# Patient Record
Sex: Female | Born: 1990
Health system: Southern US, Community
[De-identification: ages and names within clinical notes are randomized; demographics above are authoritative.]

## PROBLEM LIST (undated history)

## (undated) DIAGNOSIS — K589 Irritable bowel syndrome without diarrhea: Secondary | ICD-10-CM

## (undated) DIAGNOSIS — U071 COVID-19: Secondary | ICD-10-CM

## (undated) DIAGNOSIS — K649 Unspecified hemorrhoids: Secondary | ICD-10-CM

## (undated) HISTORY — DX: COVID-19: U07.1

## (undated) HISTORY — DX: Irritable bowel syndrome, unspecified: K58.9

## (undated) HISTORY — DX: Unspecified hemorrhoids: K64.9

## (undated) HISTORY — PX: TONSILLECTOMY: SUR1361

## (undated) HISTORY — PX: WISDOM TOOTH EXTRACTION: SHX21

---
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
## 2012-11-16 HISTORY — PX: TONSILLECTOMY: SUR1361

## 2013-10-23 ENCOUNTER — Ambulatory Visit (INDEPENDENT_AMBULATORY_CARE_PROVIDER_SITE_OTHER): Payer: BC Managed Care – PPO | Admitting: Certified Nurse Midwife

## 2013-10-23 ENCOUNTER — Encounter: Payer: Self-pay | Admitting: Certified Nurse Midwife

## 2013-10-23 VITALS — BP 110/72 | HR 64 | Resp 16 | Ht 62.5 in | Wt 166.0 lb

## 2013-10-23 DIAGNOSIS — Z91018 Allergy to other foods: Secondary | ICD-10-CM

## 2013-10-23 DIAGNOSIS — Z0000 Encounter for general adult medical examination without abnormal findings: Secondary | ICD-10-CM

## 2013-10-23 DIAGNOSIS — Z01419 Encounter for gynecological examination (general) (routine) without abnormal findings: Secondary | ICD-10-CM

## 2013-10-23 LAB — HEMOGLOBIN, FINGERSTICK: Hemoglobin, fingerstick: 13.4 g/dL (ref 12.0–16.0)

## 2013-10-23 LAB — POCT URINALYSIS DIPSTICK
Bilirubin, UA: NEGATIVE
Blood, UA: NEGATIVE
Ketones, UA: NEGATIVE
Protein, UA: NEGATIVE
pH, UA: 6

## 2013-10-23 MED ORDER — EPINEPHRINE 0.3 MG/0.3ML IJ SOAJ: 0.3000 mg | Freq: Once | INTRAMUSCULAR | Status: DC

## 2013-10-23 NOTE — Patient Instructions (Signed)
General topics  Next pap or exam is  due in 1 year Take a Women's multivitamin Take 1200 mg. of calcium daily - prefer dietary If any concerns in interim to call back  Breast Self-Awareness Practicing breast self-awareness may pick up problems early, prevent significant medical complications, and possibly save your life. By practicing breast self-awareness, you can become familiar with how your breasts look and feel and if your breasts are changing. This allows you to notice changes early. It can also offer you some reassurance that your breast health is good. One way to learn what is normal for your breasts and whether your breasts are changing is to do a breast self-exam. If you find a lump or something that was not present in the past, it is best to contact your caregiver right away. Other findings that should be evaluated by your caregiver include nipple discharge, especially if it is bloody; skin changes or reddening; areas where the skin seems to be pulled in (retracted); or new lumps and bumps. Breast pain is seldom associated with cancer (malignancy), but should also be evaluated by a caregiver. BREAST SELF-EXAM The best time to examine your breasts is 5 7 days after your menstrual period is over.  ExitCare Patient Information 2013 [employer].   Exercise to Stay Healthy Exercise helps you become and stay healthy. EXERCISE IDEAS AND TIPS Choose exercises that:  You enjoy.  Fit into your day. You do not need to exercise really hard to be healthy. You can do exercises at a slow or medium level and stay healthy. You can:  Stretch before and after working out.  Try yoga, Pilates, or tai chi.  Lift weights.  Walk fast, swim, jog, run, climb stairs, bicycle, dance, or rollerskate.  Take aerobic classes. Exercises that burn about 150 calories:  Running 1  miles in 15 minutes.  Playing volleyball for 45 to 60 minutes.  Washing and waxing a car for 45 to 60  minutes.  Playing touch football for 45 minutes.  Walking 1  miles in 35 minutes.  Pushing a stroller 1  miles in 30 minutes.  Playing basketball for 30 minutes.  Raking leaves for 30 minutes.  Bicycling 5 miles in 30 minutes.  Walking 2 miles in 30 minutes.  Dancing for 30 minutes.  Shoveling snow for 15 minutes.  Swimming laps for 20 minutes.  Walking up stairs for 15 minutes.  Bicycling 4 miles in 15 minutes.  Gardening for 30 to 45 minutes.  Jumping rope for 15 minutes.  Washing windows or floors for 45 to 60 minutes. Document Released: 12/05/2010 Document Revised: 01/25/2012 Document Reviewed: 12/05/2010 ExitCare Patient Information 2013 [employer].   Other topics ( that may be useful information):    Sexually Transmitted Disease Sexually transmitted disease (STD) refers to any infection that is passed from person to person during sexual activity. This may happen by way of saliva, semen, blood, vaginal mucus, or urine. Common STDs include:  Gonorrhea.  Chlamydia.  Syphilis.  HIV/AIDS.  Genital herpes.  Hepatitis B and C.  Trichomonas.  Human papillomavirus (HPV).  Pubic lice. CAUSES  An STD may be spread by bacteria, virus, or parasite. A person can get an STD by:  Sexual intercourse with an infected person.  Sharing sex toys with an infected person.  Sharing needles with an infected person.  Having intimate contact with the genitals, mouth, or rectal areas of an infected person. SYMPTOMS  Some people may not have any symptoms, but   they can still pass the infection to others. Different STDs have different symptoms. Symptoms include:  Painful or bloody urination.  Pain in the pelvis, abdomen, vagina, anus, throat, or eyes.  Skin rash, itching, irritation, growths, or sores (lesions). These usually occur in the genital or anal area.  Abnormal vaginal discharge.  Penile discharge in men.  Soft, flesh-colored skin growths in the  genital or anal area.  Fever.  Pain or bleeding during sexual intercourse.  Swollen glands in the groin area.  Yellow skin and eyes (jaundice). This is seen with hepatitis. DIAGNOSIS  To make a diagnosis, your caregiver may:  Take a medical history.  Perform a physical exam.  Take a specimen (culture) to be examined.  Examine a sample of discharge under a microscope.  Perform blood test TREATMENT   Chlamydia, gonorrhea, trichomonas, and syphilis can be cured with antibiotic medicine.  Genital herpes, hepatitis, and HIV can be treated, but not cured, with prescribed medicines. The medicines will lessen the symptoms.  Genital warts from HPV can be treated with medicine or by freezing, burning (electrocautery), or surgery. Warts may come back.  HPV is a virus and cannot be cured with medicine or surgery.However, abnormal areas may be followed very closely by your caregiver and may be removed from the cervix, vagina, or vulva through office procedures or surgery. If your diagnosis is confirmed, your recent sexual partners need treatment. This is true even if they are symptom-free or have a negative culture or evaluation. They should not have sex until their caregiver says it is okay. HOME CARE INSTRUCTIONS  All sexual partners should be informed, tested, and treated for all STDs.  Take your antibiotics as directed. Finish them even if you start to feel better.  Only take over-the-counter or prescription medicines for pain, discomfort, or fever as directed by your caregiver.  Rest.  Eat a balanced diet and drink enough fluids to keep your urine clear or pale yellow.  Do not have sex until treatment is completed and you have followed up with your caregiver. STDs should be checked after treatment.  Keep all follow-up appointments, Pap tests, and blood tests as directed by your caregiver.  Only use latex condoms and water-soluble lubricants during sexual activity. Do not use  petroleum jelly or oils.  Avoid alcohol and illegal drugs.  Get vaccinated for HPV and hepatitis. If you have not received these vaccines in the past, talk to your caregiver about whether one or both might be right for you.  Avoid risky sex practices that can break the skin. The only way to avoid getting an STD is to avoid all sexual activity.Latex condoms and dental dams (for oral sex) will help lessen the risk of getting an STD, but will not completely eliminate the risk. SEEK MEDICAL CARE IF:   You have a fever.  You have any new or worsening symptoms. Document Released: 01/23/2003 Document Revised: 01/25/2012 Document Reviewed: 01/30/2011 ExitCare Patient Information 2013 [employer].    Domestic Abuse You are being battered or abused if someone close to you hits, pushes, or physically hurts you in any way. You also are being abused if you are forced into activities. You are being sexually abused if you are forced to have sexual contact of any kind. You are being emotionally abused if you are made to feel worthless or if you are constantly threatened. It is important to remember that help is available. No one has the right to abuse you. PREVENTION OF FURTHER   ABUSE  Learn the warning signs of danger. This varies with situations but may include: the use of alcohol, threats, isolation from friends and family, or forced sexual contact. Leave if you feel that violence is going to occur.  If you are attacked or beaten, report it to the police so the abuse is documented. You do not have to press charges. The police can protect you while you or the attackers are leaving. Get the officer's name and badge number and a copy of the report.  Find someone you can trust and tell them what is happening to you: your caregiver, a nurse, clergy member, close friend or family member. Feeling ashamed is natural, but remember that you have done nothing wrong. No one deserves abuse. Document Released:  10/30/2000 Document Revised: 01/25/2012 Document Reviewed: 01/08/2011 ExitCare Patient Information 2013 [employer]Care[employer].    How Much is Too Much Alcohol? Drinking too much alcohol can cause injury, accidents, and health problems. These types of problems can include:   Car crashes.  Falls.  Family fighting (domestic violence).  Drowning.  Fights.  Injuries.  Burns.  Damage to certain organs.  Having a baby with birth defects. ONE DRINK CAN BE TOO MUCH WHEN YOU ARE:  Working.  Pregnant or breastfeeding.  Taking medicines. Ask your doctor.  Driving or planning to drive. If you or someone you know has a drinking problem, get help from a doctor.  Document Released: 08/29/2009 Document Revised: 01/25/2012 Document Reviewed: 08/29/2009 ExitCare Patient Information 2013 [employer].   Smoking Hazards Smoking cigarettes is extremely bad for your health. Tobacco smoke has over 200 known poisons in it. There are over 60 chemicals in tobacco smoke that cause cancer. Some of the chemicals found in cigarette smoke include:   Cyanide.  Benzene.  Formaldehyde.  Methanol (wood alcohol).  Acetylene (fuel used in welding torches).  Ammonia. Cigarette smoke also contains the poisonous gases nitrogen oxide and carbon monoxide.  Cigarette smokers have an increased risk of many serious medical problems and Smoking causes approximately:  90% of all lung cancer deaths in men.  80% of all lung cancer deaths in women.  90% of deaths from chronic obstructive lung disease. Compared with nonsmokers, smoking increases the risk of:  Coronary heart disease by 2 to 4 times.  Stroke by 2 to 4 times.  Men developing lung cancer by 23 times.  Women developing lung cancer by 13 times.  Dying from chronic obstructive lung diseases by 12 times.  . Smoking is the most preventable cause of death and disease in our society.  WHY IS SMOKING ADDICTIVE?  Nicotine is the chemical  agent in tobacco that is capable of causing addiction or dependence.  When you smoke and inhale, nicotine is absorbed rapidly into the bloodstream through your lungs. Nicotine absorbed through the lungs is capable of creating a powerful addiction. Both inhaled and non-inhaled nicotine may be addictive.  Addiction studies of cigarettes and spit tobacco show that addiction to nicotine occurs mainly during the teen years, when young people begin using tobacco products. WHAT ARE THE BENEFITS OF QUITTING?  There are many health benefits to quitting smoking.   Likelihood of developing cancer and heart disease decreases. Health improvements are seen almost immediately.  Blood pressure, pulse rate, and breathing patterns start returning to normal soon after quitting. QUITTING SMOKING   American Lung Association - 1-800-LUNGUSA  American Cancer Society - 1-800-ACS-2345 Document Released: 12/10/2004 Document Revised: 01/25/2012 Document Reviewed: 08/14/2009 ExitCare Patient Information 2013 ExitCare[employer]   LL[employer].   Stress Management Stress is a state of physical or mental tension that often results from changes in your life or normal routine. Some common causes of stress are:  Death of a loved one.  Injuries or severe illnesses.  Getting fired or changing jobs.  Moving into a new home. Other causes may be:  Sexual problems.  Business or financial losses.  Taking on a large debt.  Regular conflict with someone at home or at work.  Constant tiredness from lack of sleep. It is not just bad things that are stressful. It may be stressful to:  Win the lottery.  Get married.  Buy a new car. The amount of stress that can be easily tolerated varies from person to person. Changes generally cause stress, regardless of the types of change. Too much stress can affect your health. It may lead to physical or emotional problems. Too little stress (boredom) may also become stressful. SUGGESTIONS TO  REDUCE STRESS:  Talk things over with your family and friends. It often is helpful to share your concerns and worries. If you feel your problem is serious, you may want to get help from a professional counselor.  Consider your problems one at a time instead of lumping them all together. Trying to take care of everything at once may seem impossible. List all the things you need to do and then start with the most important one. Set a goal to accomplish 2 or 3 things each day. If you expect to do too many in a single day you will naturally fail, causing you to feel even more stressed.  Do not use alcohol or drugs to relieve stress. Although you may feel better for a short time, they do not remove the problems that caused the stress. They can also be habit forming.  Exercise regularly - at least 3 times per week. Physical exercise can help to relieve that "uptight" feeling and will relax you.  The shortest distance between despair and hope is often a good night's sleep.  Go to bed and get up on time allowing yourself time for appointments without being rushed.  Take a short "time-out" period from any stressful situation that occurs during the day. Close your eyes and take some deep breaths. Starting with the muscles in your face, tense them, hold it for a few seconds, then relax. Repeat this with the muscles in your neck, shoulders, hand, stomach, back and legs.  Take good care of yourself. Eat a balanced diet and get plenty of rest.  Schedule time for having fun. Take a break from your daily routine to relax. HOME CARE INSTRUCTIONS   Call if you feel overwhelmed by your problems and feel you can no longer manage them on your own.  Return immediately if you feel like hurting yourself or someone else. Document Released: 04/28/2001 Document Revised: 01/25/2012 Document Reviewed: 12/19/2007 ExitCare Patient Information 2013 ExitCare[employer].   

## 2013-10-23 NOTE — Progress Notes (Signed)
22 y.o. G0P0000 Single Caucasian Fe here for annual exam. Periods normal no issues. Not sexually active ever. 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 Will be studying in [place] in 2015! Needs new epi pen to take with her on trip, due tree nut allergic reaction. (hers has expired). Aware how to use, if needed. No health issues today. Sees PCP prn.  Patient's last menstrual period was 10/15/2013.          Sexually active: no  The current method of family planning is abstinence.    Exercising: yes  low impact cardio & yoga Smoker:  no  Health Maintenance: Pap:  none MMG:  none Colonoscopy:  none BMD:   none TDaP:  2013 Labs: HGB-13.4, Poct urine-ph6.0 Self breast exam: done monthly   reports that she has never smoked. She does not have any smokeless tobacco history on file. She reports that she does not drink alcohol or use illicit drugs.  History reviewed. No pertinent past medical history.  Past Surgical History  Procedure Laterality Date  . Wisdom tooth extraction    . Tonsillectomy      No current outpatient prescriptions on file.   No current facility-administered medications for this visit.    Family History  Problem Relation Age of Onset  . Stroke Father     ROS:  Pertinent items are noted in HPI.  Otherwise, a comprehensive ROS was negative.  Exam:   BP 110/72  Pulse 64  Resp 16  Ht 5' 2.5" (1.588 m)  Wt 166 lb (75.297 kg)  BMI 29.86 kg/m2  LMP 10/15/2013 Height: 5' 2.5" (158.8 cm)  Ht Readings from Last 3 Encounters:  10/23/13 5' 2.5" (1.588 m)    General appearance: alert, cooperative and appears stated age Head: Normocephalic, without obvious abnormality, atraumatic Neck: no adenopathy, supple, symmetrical, trachea midline and thyroid normal to inspection and palpation and non-palpable Lungs: clear to auscultation bilaterally Breasts: normal appearance, no masses or tenderness, No nipple retraction or dimpling, No nipple discharge or bleeding, No  axillary or supraclavicular adenopathy Heart: regular rate and rhythm Abdomen: soft, non-tender; no masses,  no organomegaly Extremities: extremities normal, atraumatic, no cyanosis or edema Skin: Skin color, texture, turgor normal. No rashes or lesions Lymph nodes: Cervical, supraclavicular, and axillary nodes normal. No abnormal inguinal nodes palpated Neurologic: Grossly normal   Pelvic: External genitalia:  no lesions              Urethra:  normal appearing urethra with no masses, tenderness or lesions              Bartholin's and Skene's: normal                 Vagina: normal appearing vagina with normal color and discharge, no lesions              Cervix: normal, non tender              Pap taken: no Bimanual Exam:  Uterus:  normal size, contour, position, consistency, mobility, non-tender and anteverted              Adnexa: normal adnexa and no mass, fullness, tenderness               Rectovaginal: Confirms               Anus:  deferred  A:  Well Woman with normal exam  Contraception none needed never sexually active  History of severe allergy with tree nuts,  needs refill on Epi pen  P:   Reviewed health and wellness pertinent to exam  Will advise if change  Rx Epi pen see order with instructions on how to use reviewed  Pap smear as per guidelines at age 46  pap smear not taken today   counseled on breast self exam, STD prevention, HIV risk factors and prevention, adequate intake of calcium and vitamin D, diet and exercise  return annually or prn  An After Visit Summary was printed and given to the patient.

## 2013-10-24 NOTE — Progress Notes (Signed)
Note reviewed, agree with plan.  Jawanna Dykman, MD  

## 2014-10-17 DIAGNOSIS — B9689 Other specified bacterial agents as the cause of diseases classified elsewhere: Secondary | ICD-10-CM | POA: Insufficient documentation

## 2014-10-17 DIAGNOSIS — J019 Acute sinusitis, unspecified: Secondary | ICD-10-CM

## 2014-11-13 ENCOUNTER — Encounter: Payer: Self-pay | Admitting: Certified Nurse Midwife

## 2014-11-13 ENCOUNTER — Ambulatory Visit (INDEPENDENT_AMBULATORY_CARE_PROVIDER_SITE_OTHER): Payer: BC Managed Care – PPO | Admitting: Certified Nurse Midwife

## 2014-11-13 VITALS — BP 110/68 | HR 70 | Resp 16 | Ht 62.25 in | Wt 147.0 lb

## 2014-11-13 DIAGNOSIS — Z0000 Encounter for general adult medical examination without abnormal findings: Secondary | ICD-10-CM

## 2014-11-13 DIAGNOSIS — Z124 Encounter for screening for malignant neoplasm of cervix: Secondary | ICD-10-CM

## 2014-11-13 DIAGNOSIS — Z01419 Encounter for gynecological examination (general) (routine) without abnormal findings: Secondary | ICD-10-CM

## 2014-11-13 LAB — POCT URINALYSIS DIPSTICK
BILIRUBIN UA: NEGATIVE
Glucose, UA: NEGATIVE
KETONES UA: NEGATIVE
Leukocytes, UA: NEGATIVE
Nitrite, UA: NEGATIVE
Protein, UA: NEGATIVE
RBC UA: NEGATIVE
UROBILINOGEN UA: NEGATIVE
pH, UA: 5

## 2014-11-13 NOTE — Progress Notes (Signed)
23 y.o. G0P0000 Single Caucasian Fe here for annual exam. Periods normal, no issues. Contraception condoms working well. Desires STD screening. Enjoying college at [employer]!  Has lost 21 pounds since last aex!  Sees urgent care as needed. No health issues today.  Patient's last menstrual period was 11/10/2014.          Sexually active: Yes.    The current method of family planning is condoms most of the time.    Exercising: No.  exercise Smoker:  no  Health Maintenance: Pap:  none MMG:  none Colonoscopy:  none BMD:   none TDaP:  2013 Labs: Poct urine-neg Self breast exam: done monthly   reports that she has never smoked. She does not have any smokeless tobacco history on file. She reports that she drinks alcohol. She reports that she does not use illicit drugs.  History reviewed. No pertinent past medical history.  Past Surgical History  Procedure Laterality Date  . Wisdom tooth extraction    . Tonsillectomy      Current Outpatient Prescriptions  Medication Sig Dispense Refill  . EPINEPHrine (EPI-PEN) 0.3 mg/0.3 mL SOAJ injection Inject 0.3 mLs (0.3 mg total) into the muscle once. 2 Device 0   No current facility-administered medications for this visit.    Family History  Problem Relation Age of Onset  . Stroke Father     ROS:  Pertinent items are noted in HPI.  Otherwise, a comprehensive ROS was negative.  Exam:   BP 110/68 mmHg  Pulse 70  Resp 16  Ht 5' 2.25" (1.581 m)  Wt 147 lb (66.679 kg)  BMI 26.68 kg/m2  LMP 11/10/2014 Height: 5' 2.25" (158.1 cm)  Ht Readings from Last 3 Encounters:  11/13/14 5' 2.25" (1.581 m)  10/23/13 5' 2.5" (1.588 m)    General appearance: alert, cooperative and appears stated age Head: Normocephalic, without obvious abnormality, atraumatic Neck: no adenopathy, supple, symmetrical, trachea midline and thyroid normal to inspection and palpation Lungs: clear to auscultation bilaterally Breasts: normal appearance, no masses or  tenderness, No nipple retraction or dimpling, No nipple discharge or bleeding, No axillary or supraclavicular adenopathy Heart: regular rate and rhythm Abdomen: soft, non-tender; no masses,  no organomegaly Extremities: extremities normal, atraumatic, no cyanosis or edema Skin: Skin color, texture, turgor normal. No rashes or lesions Lymph nodes: Cervical, supraclavicular, and axillary nodes normal. No abnormal inguinal nodes palpated Neurologic: Grossly normal   Pelvic: External genitalia:  no lesions              Urethra:  normal appearing urethra with no masses, tenderness or lesions              Bartholin's and Skene's: normal                 Vagina: normal appearing vagina with normal color and discharge, no lesions              Cervix: normal, non tender, no lesions              Pap taken: Yes.   Bimanual Exam:  Uterus:  normal size, contour, position, consistency, mobility, non-tender and anteverted              Adnexa: normal adnexa and no mass, fullness, tenderness               Rectovaginal: Confirms               Anus:  normal sphincter tone, no lesions  A:  Well Woman with normal exam  Contraception consistent condom use  STD screening GC, Chlamydia only  P:   Reviewed health and wellness pertinent to exam  Discussed importance of consistent use for contraception  Labs: GC,Chlamydia  Pap smear taken today with HPV reflex   counseled on breast self exam, STD prevention, HIV risk factors and prevention, adequate intake of calcium and vitamin D, diet and exercise  return annually or prn  An After Visit Summary was printed and given to the patient.

## 2014-11-13 NOTE — Patient Instructions (Signed)
General topics  Next pap or exam is  due in 1 year Take a Women's multivitamin Take 1200 mg. of calcium daily - prefer dietary If any concerns in interim to call back  Breast Self-Awareness Practicing breast self-awareness may pick up problems early, prevent significant medical complications, and possibly save your life. By practicing breast self-awareness, you can become familiar with how your breasts look and feel and if your breasts are changing. This allows you to notice changes early. It can also offer you some reassurance that your breast health is good. One way to learn what is normal for your breasts and whether your breasts are changing is to do a breast self-exam. If you find a lump or something that was not present in the past, it is best to contact your caregiver right away. Other findings that should be evaluated by your caregiver include nipple discharge, especially if it is bloody; skin changes or reddening; areas where the skin seems to be pulled in (retracted); or new lumps and bumps. Breast pain is seldom associated with cancer (malignancy), but should also be evaluated by a caregiver. BREAST SELF-EXAM The best time to examine your breasts is 5 7 days after your menstrual period is over.  ExitCare Patient Information 2013 [employer].   Exercise to Stay Healthy Exercise helps you become and stay healthy. EXERCISE IDEAS AND TIPS Choose exercises that:  You enjoy.  Fit into your day. You do not need to exercise really hard to be healthy. You can do exercises at a slow or medium level and stay healthy. You can:  Stretch before and after working out.  Try yoga, Pilates, or tai chi.  Lift weights.  Walk fast, swim, jog, run, climb stairs, bicycle, dance, or rollerskate.  Take aerobic classes. Exercises that burn about 150 calories:  Running 1  miles in 15 minutes.  Playing volleyball for 45 to 60 minutes.  Washing and waxing a car for 45 to 60  minutes.  Playing touch football for 45 minutes.  Walking 1  miles in 35 minutes.  Pushing a stroller 1  miles in 30 minutes.  Playing basketball for 30 minutes.  Raking leaves for 30 minutes.  Bicycling 5 miles in 30 minutes.  Walking 2 miles in 30 minutes.  Dancing for 30 minutes.  Shoveling snow for 15 minutes.  Swimming laps for 20 minutes.  Walking up stairs for 15 minutes.  Bicycling 4 miles in 15 minutes.  Gardening for 30 to 45 minutes.  Jumping rope for 15 minutes.  Washing windows or floors for 45 to 60 minutes. Document Released: 12/05/2010 Document Revised: 01/25/2012 Document Reviewed: 12/05/2010 [employer] Patient Information 2013 [employer].   Other topics ( that may be useful information):    Sexually Transmitted Disease Sexually transmitted disease (STD) refers to any infection that is passed from person to person during sexual activity. This may happen by way of saliva, semen, blood, vaginal mucus, or urine. Common STDs include:  Gonorrhea.  Chlamydia.  Syphilis.  HIV/AIDS.  Genital herpes.  Hepatitis B and C.  Trichomonas.  Human papillomavirus (HPV).  Pubic lice. CAUSES  An STD may be spread by bacteria, virus, or parasite. A person can get an STD by:  Sexual intercourse with an infected person.  Sharing sex toys with an infected person.  Sharing needles with an infected person.  Having intimate contact with the genitals, mouth, or rectal areas of an infected person. SYMPTOMS  Some people may not have any symptoms, but   they can still pass the infection to others. Different STDs have different symptoms. Symptoms include:  Painful or bloody urination.  Pain in the pelvis, abdomen, vagina, anus, throat, or eyes.  Skin rash, itching, irritation, growths, or sores (lesions). These usually occur in the genital or anal area.  Abnormal vaginal discharge.  Penile discharge in men.  Soft, flesh-colored skin growths in the  genital or anal area.  Fever.  Pain or bleeding during sexual intercourse.  Swollen glands in the groin area.  Yellow skin and eyes (jaundice). This is seen with hepatitis. DIAGNOSIS  To make a diagnosis, your caregiver may:  Take a medical history.  Perform a physical exam.  Take a specimen (culture) to be examined.  Examine a sample of discharge under a microscope.  Perform blood test TREATMENT   Chlamydia, gonorrhea, trichomonas, and syphilis can be cured with antibiotic medicine.  Genital herpes, hepatitis, and HIV can be treated, but not cured, with prescribed medicines. The medicines will lessen the symptoms.  Genital warts from HPV can be treated with medicine or by freezing, burning (electrocautery), or surgery. Warts may come back.  HPV is a virus and cannot be cured with medicine or surgery.However, abnormal areas may be followed very closely by your caregiver and may be removed from the cervix, vagina, or vulva through office procedures or surgery. If your diagnosis is confirmed, your recent sexual partners need treatment. This is true even if they are symptom-free or have a negative culture or evaluation. They should not have sex until their caregiver says it is okay. HOME CARE INSTRUCTIONS  All sexual partners should be informed, tested, and treated for all STDs.  Take your antibiotics as directed. Finish them even if you start to feel better.  Only take over-the-counter or prescription medicines for pain, discomfort, or fever as directed by your caregiver.  Rest.  Eat a balanced diet and drink enough fluids to keep your urine clear or pale yellow.  Do not have sex until treatment is completed and you have followed up with your caregiver. STDs should be checked after treatment.  Keep all follow-up appointments, Pap tests, and blood tests as directed by your caregiver.  Only use latex condoms and water-soluble lubricants during sexual activity. Do not use  petroleum jelly or oils.  Avoid alcohol and illegal drugs.  Get vaccinated for HPV and hepatitis. If you have not received these vaccines in the past, talk to your caregiver about whether one or both might be right for you.  Avoid risky sex practices that can break the skin. The only way to avoid getting an STD is to avoid all sexual activity.Latex condoms and dental dams (for oral sex) will help lessen the risk of getting an STD, but will not completely eliminate the risk. SEEK MEDICAL CARE IF:   You have a fever.  You have any new or worsening symptoms. Document Released: 01/23/2003 Document Revised: 01/25/2012 Document Reviewed: 01/30/2011 [facility] Patient Information 2013 [employer].    Domestic Abuse You are being battered or abused if someone close to you hits, pushes, or physically hurts you in any way. You also are being abused if you are forced into activities. You are being sexually abused if you are forced to have sexual contact of any kind. You are being emotionally abused if you are made to feel worthless or if you are constantly threatened. It is important to remember that help is available. No one has the right to abuse you. PREVENTION OF FURTHER  ABUSE  Learn the warning signs of danger. This varies with situations but may include: the use of alcohol, threats, isolation from friends and family, or forced sexual contact. Leave if you feel that violence is going to occur.  If you are attacked or beaten, report it to the police so the abuse is documented. You do not have to press charges. The police can protect you while you or the attackers are leaving. Get the officer's name and badge number and a copy of the report.  Find someone you can trust and tell them what is happening to you: your caregiver, a nurse, clergy member, close friend or family member. Feeling ashamed is natural, but remember that you have done nothing wrong. No one deserves abuse. Document Released:  10/30/2000 Document Revised: 01/25/2012 Document Reviewed: 01/08/2011 [employer] Patient Information 2013 [employer].    How Much is Too Much Alcohol? Drinking too much alcohol can cause injury, accidents, and health problems. These types of problems can include:   Car crashes.  Falls.  Family fighting (domestic violence).  Drowning.  Fights.  Injuries.  Burns.  Damage to certain organs.  Having a baby with birth defects. ONE DRINK CAN BE TOO MUCH WHEN YOU ARE:  Working.  Pregnant or breastfeeding.  Taking medicines. Ask your doctor.  Driving or planning to drive. If you or someone you know has a drinking problem, get help from a doctor.  Document Released: 08/29/2009 Document Revised: 01/25/2012 Document Reviewed: 08/29/2009 Exit[employer] Patient Information 2013 [employer].   Smoking Hazards Smoking cigarettes is extremely bad for your health. Tobacco smoke has over 200 known poisons in it. There are over 60 chemicals in tobacco smoke that cause cancer. Some of the chemicals found in cigarette smoke include:   Cyanide.  Benzene.  Formaldehyde.  Methanol (wood alcohol).  Acetylene (fuel used in welding torches).  Ammonia. Cigarette smoke also contains the poisonous gases nitrogen oxide and carbon monoxide.  Cigarette smokers have an increased risk of many serious medical problems and Smoking causes approximately:  90% of all lung cancer deaths in men.  80% of all lung cancer deaths in women.  90% of deaths from chronic obstructive lung disease. Compared with nonsmokers, smoking increases the risk of:  Coronary heart disease by 2 to 4 times.  Stroke by 2 to 4 times.  Men developing lung cancer by 23 times.  Women developing lung cancer by 13 times.  Dying from chronic obstructive lung diseases by 12 times.  . Smoking is the most preventable cause of death and disease in our society.  WHY IS SMOKING ADDICTIVE?  Nicotine is the chemical  agent in tobacco that is capable of causing addiction or dependence.  When you smoke and inhale, nicotine is absorbed rapidly into the bloodstream through your lungs. Nicotine absorbed through the lungs is capable of creating a powerful addiction. Both inhaled and non-inhaled nicotine may be addictive.  Addiction studies of cigarettes and spit tobacco show that addiction to nicotine occurs mainly during the teen years, when young people begin using tobacco products. WHAT ARE THE BENEFITS OF QUITTING?  There are many health benefits to quitting smoking.   Likelihood of developing cancer and heart disease decreases. Health improvements are seen almost immediately.  Blood pressure, pulse rate, and breathing patterns start returning to normal soon after quitting. QUITTING SMOKING   American Lung Association - 1-800-LUNGUSA  American Cancer Society - 1-800-ACS-2345 Document Released: 12/10/2004 Document Revised: 01/25/2012 Document Reviewed: 08/14/2009 Exit[employer] Patient Information 2013 [employer].   Stress Management Stress is a state of physical or mental tension that often results from changes in your life or normal routine. Some common causes of stress are:  Death of a loved one.  Injuries or severe illnesses.  Getting fired or changing jobs.  Moving into a new home. Other causes may be:  Sexual problems.  Business or financial losses.  Taking on a large debt.  Regular conflict with someone at home or at work.  Constant tiredness from lack of sleep. It is not just bad things that are stressful. It may be stressful to:  Win the lottery.  Get married.  Buy a new car. The amount of stress that can be easily tolerated varies from person to person. Changes generally cause stress, regardless of the types of change. Too much stress can affect your health. It may lead to physical or emotional problems. Too little stress (boredom) may also become stressful. SUGGESTIONS TO  REDUCE STRESS:  Talk things over with your family and friends. It often is helpful to share your concerns and worries. If you feel your problem is serious, you may want to get help from a professional counselor.  Consider your problems one at a time instead of lumping them all together. Trying to take care of everything at once may seem impossible. List all the things you need to do and then start with the most important one. Set a goal to accomplish 2 or 3 things each day. If you expect to do too many in a single day you will naturally fail, causing you to feel even more stressed.  Do not use alcohol or drugs to relieve stress. Although you may feel better for a short time, they do not remove the problems that caused the stress. They can also be habit forming.  Exercise regularly - at least 3 times per week. Physical exercise can help to relieve that "uptight" feeling and will relax you.  The shortest distance between despair and hope is often a good night's sleep.  Go to bed and get up on time allowing yourself time for appointments without being rushed.  Take a short "time-out" period from any stressful situation that occurs during the day. Close your eyes and take some deep breaths. Starting with the muscles in your face, tense them, hold it for a few seconds, then relax. Repeat this with the muscles in your neck, shoulders, hand, stomach, back and legs.  Take good care of yourself. Eat a balanced diet and get plenty of rest.  Schedule time for having fun. Take a break from your daily routine to relax. HOME CARE INSTRUCTIONS   Call if you feel overwhelmed by your problems and feel you can no longer manage them on your own.  Return immediately if you feel like hurting yourself or someone else. Document Released: 04/28/2001 Document Revised: 01/25/2012 Document Reviewed: 12/19/2007 Exit[employer] Patient Information 2013 [employer].   

## 2014-11-14 LAB — IPS PAP TEST WITH REFLEX TO HPV

## 2014-11-14 NOTE — Progress Notes (Signed)
Reviewed personally.  M. Suzanne Pamela Maddy, MD.  

## 2014-11-15 LAB — IPS N GONORRHOEA AND CHLAMYDIA BY PCR

## 2015-05-22 ENCOUNTER — Telehealth: Payer: Self-pay | Admitting: Certified Nurse Midwife

## 2015-05-22 NOTE — Telephone Encounter (Signed)
Spoke with patient. Patient states that she would like to come in for annual exam. Advised last aex was 11/13/2014. Insurance covers 1 aex per year. Patient denies any current concerns or problems. Offered to schedule appointment for OV if she would like to meet with Felicia Lambert CNM and have any labs performed. Patient declines appointment. Patient would like to wait until aex appointment. Will return call if she would like to be seen earlier in office.  Routing to provider for final review. Patient agreeable to disposition. Will close encounter.   Patient aware provider will review message and nurse will return call if any additional advice or change of disposition.

## 2015-05-22 NOTE — Telephone Encounter (Signed)
Patient says she didn't get bloodwork done last time she was in and would like to come in for that.

## 2015-11-21 ENCOUNTER — Ambulatory Visit (INDEPENDENT_AMBULATORY_CARE_PROVIDER_SITE_OTHER): Payer: BLUE CROSS/BLUE SHIELD | Admitting: Certified Nurse Midwife

## 2015-11-21 ENCOUNTER — Encounter: Payer: Self-pay | Admitting: Certified Nurse Midwife

## 2015-11-21 VITALS — BP 104/60 | HR 68 | Resp 16 | Ht 62.5 in | Wt 140.0 lb

## 2015-11-21 DIAGNOSIS — Z01419 Encounter for gynecological examination (general) (routine) without abnormal findings: Secondary | ICD-10-CM

## 2015-11-21 DIAGNOSIS — Z30011 Encounter for initial prescription of contraceptive pills: Secondary | ICD-10-CM | POA: Diagnosis not present

## 2015-11-21 DIAGNOSIS — Z0000 Encounter for general adult medical examination without abnormal findings: Secondary | ICD-10-CM

## 2015-11-21 LAB — CBC
HEMATOCRIT: 39 % (ref 36.0–46.0)
Hemoglobin: 13.4 g/dL (ref 12.0–15.0)
MCH: 29.7 pg (ref 26.0–34.0)
MCHC: 34.4 g/dL (ref 30.0–36.0)
MCV: 86.5 fL (ref 78.0–100.0)
MPV: 9.9 fL (ref 8.6–12.4)
Platelets: 282 10*3/uL (ref 150–400)
RBC: 4.51 MIL/uL (ref 3.87–5.11)
RDW: 14 % (ref 11.5–15.5)
WBC: 8.9 10*3/uL (ref 4.0–10.5)

## 2015-11-21 LAB — POCT URINALYSIS DIPSTICK
Bilirubin, UA: NEGATIVE
Blood, UA: NEGATIVE
Glucose, UA: NEGATIVE
Ketones, UA: NEGATIVE
LEUKOCYTES UA: NEGATIVE
NITRITE UA: NEGATIVE
PROTEIN UA: NEGATIVE
Urobilinogen, UA: NEGATIVE
pH, UA: 5

## 2015-11-21 LAB — HEMOGLOBIN, FINGERSTICK: Hemoglobin, fingerstick: 12.9 g/dL (ref 12.0–16.0)

## 2015-11-21 MED ORDER — NORETHIN ACE-ETH ESTRAD-FE 1-20 MG-MCG(24) PO TABS: 1.0000 | ORAL_TABLET | Freq: Every day | ORAL | Status: DC

## 2015-11-21 NOTE — Patient Instructions (Addendum)
General topics  Next pap or exam is  due in 1 year Take a Women's multivitamin Take 1200 mg. of calcium daily - prefer dietary If any concerns in interim to call back  Breast Self-Awareness Practicing breast self-awareness may pick up problems early, prevent significant medical complications, and possibly save your life. By practicing breast self-awareness, you can become familiar with how your breasts look and feel and if your breasts are changing. This allows you to notice changes early. It can also offer you some reassurance that your breast health is good. One way to learn what is normal for your breasts and whether your breasts are changing is to do a breast self-exam. If you find a lump or something that was not present in the past, it is best to contact your caregiver right away. Other findings that should be evaluated by your caregiver include nipple discharge, especially if it is bloody; skin changes or reddening; areas where the skin seems to be pulled in (retracted); or new lumps and bumps. Breast pain is seldom associated with cancer (malignancy), but should also be evaluated by a caregiver. BREAST SELF-EXAM The best time to examine your breasts is 5 7 days after your menstrual period is over.  ExitCare Patient Information 2013 [employer].   Exercise to Stay Healthy Exercise helps you become and stay healthy. EXERCISE IDEAS AND TIPS Choose exercises that:  You enjoy.  Fit into your day. You do not need to exercise really hard to be healthy. You can do exercises at a slow or medium level and stay healthy. You can:  Stretch before and after working out.  Try yoga, Pilates, or tai chi.  Lift weights.  Walk fast, swim, jog, run, climb stairs, bicycle, dance, or rollerskate.  Take aerobic classes. Exercises that burn about 150 calories:  Running 1  miles in 15 minutes.  Playing volleyball for 45 to 60 minutes.  Washing and waxing a car for 45 to 60  minutes.  Playing touch football for 45 minutes.  Walking 1  miles in 35 minutes.  Pushing a stroller 1  miles in 30 minutes.  Playing basketball for 30 minutes.  Raking leaves for 30 minutes.  Bicycling 5 miles in 30 minutes.  Walking 2 miles in 30 minutes.  Dancing for 30 minutes.  Shoveling snow for 15 minutes.  Swimming laps for 20 minutes.  Walking up stairs for 15 minutes.  Bicycling 4 miles in 15 minutes.  Gardening for 30 to 45 minutes.  Jumping rope for 15 minutes.  Washing windows or floors for 45 to 60 minutes. Document Released: 12/05/2010 Document Revised: 01/25/2012 Document Reviewed: 12/05/2010 ExitCare Patient Information 2013 [employer].   Other topics ( that may be useful information):    Sexually Transmitted Disease Sexually transmitted disease (STD) refers to any infection that is passed from person to person during sexual activity. This may happen by way of saliva, semen, blood, vaginal mucus, or urine. Common STDs include:  Gonorrhea.  Chlamydia.  Syphilis.  HIV/AIDS.  Genital herpes.  Hepatitis B and C.  Trichomonas.  Human papillomavirus (HPV).  Pubic lice. CAUSES  An STD may be spread by bacteria, virus, or parasite. A person can get an STD by:  Sexual intercourse with an infected person.  Sharing sex toys with an infected person.  Sharing needles with an infected person.  Having intimate contact with the genitals, mouth, or rectal areas of an infected person. SYMPTOMS  Some people may not have any symptoms, but   they can still pass the infection to others. Different STDs have different symptoms. Symptoms include:  Painful or bloody urination.  Pain in the pelvis, abdomen, vagina, anus, throat, or eyes.  Skin rash, itching, irritation, growths, or sores (lesions). These usually occur in the genital or anal area.  Abnormal vaginal discharge.  Penile discharge in men.  Soft, flesh-colored skin growths in the  genital or anal area.  Fever.  Pain or bleeding during sexual intercourse.  Swollen glands in the groin area.  Yellow skin and eyes (jaundice). This is seen with hepatitis. DIAGNOSIS  To make a diagnosis, your caregiver may:  Take a medical history.  Perform a physical exam.  Take a specimen (culture) to be examined.  Examine a sample of discharge under a microscope.  Perform blood test TREATMENT   Chlamydia, gonorrhea, trichomonas, and syphilis can be cured with antibiotic medicine.  Genital herpes, hepatitis, and HIV can be treated, but not cured, with prescribed medicines. The medicines will lessen the symptoms.  Genital warts from HPV can be treated with medicine or by freezing, burning (electrocautery), or surgery. Warts may come back.  HPV is a virus and cannot be cured with medicine or surgery.However, abnormal areas may be followed very closely by your caregiver and may be removed from the cervix, vagina, or vulva through office procedures or surgery. If your diagnosis is confirmed, your recent sexual partners need treatment. This is true even if they are symptom-free or have a negative culture or evaluation. They should not have sex until their caregiver says it is okay. HOME CARE INSTRUCTIONS  All sexual partners should be informed, tested, and treated for all STDs.  Take your antibiotics as directed. Finish them even if you start to feel better.  Only take over-the-counter or prescription medicines for pain, discomfort, or fever as directed by your caregiver.  Rest.  Eat a balanced diet and drink enough fluids to keep your urine clear or pale yellow.  Do not have sex until treatment is completed and you have followed up with your caregiver. STDs should be checked after treatment.  Keep all follow-up appointments, Pap tests, and blood tests as directed by your caregiver.  Only use latex condoms and water-soluble lubricants during sexual activity. Do not use  petroleum jelly or oils.  Avoid alcohol and illegal drugs.  Get vaccinated for HPV and hepatitis. If you have not received these vaccines in the past, talk to your caregiver about whether one or both might be right for you.  Avoid risky sex practices that can break the skin. The only way to avoid getting an STD is to avoid all sexual activity.Latex condoms and dental dams (for oral sex) will help lessen the risk of getting an STD, but will not completely eliminate the risk. SEEK MEDICAL CARE IF:   You have a fever.  You have any new or worsening symptoms. Document Released: 01/23/2003 Document Revised: 01/25/2012 Document Reviewed: 01/30/2011 [facility] Patient Information 2013 [place].    Domestic Abuse You are being battered or abused if someone close to you hits, pushes, or physically hurts you in any way. You also are being abused if you are forced into activities. You are being sexually abused if you are forced to have sexual contact of any kind. You are being emotionally abused if you are made to feel worthless or if you are constantly threatened. It is important to remember that help is available. No one has the right to abuse you. PREVENTION OF FURTHER  ABUSE  Learn the warning signs of danger. This varies with situations but may include: the use of alcohol, threats, isolation from friends and family, or forced sexual contact. Leave if you feel that violence is going to occur.  If you are attacked or beaten, report it to the police so the abuse is documented. You do not have to press charges. The police can protect you while you or the attackers are leaving. Get the officer's name and badge number and a copy of the report.  Find someone you can trust and tell them what is happening to you: your caregiver, a nurse, clergy member, close friend or family member. Feeling ashamed is natural, but remember that you have done nothing wrong. No one deserves abuse. Document Released:  10/30/2000 Document Revised: 01/25/2012 Document Reviewed: 01/08/2011 ExitCare Patient Information 2013 [employer].    How Much is Too Much Alcohol? Drinking too much alcohol can cause injury, accidents, and health problems. These types of problems can include:   Car crashes.  Falls.  Family fighting (domestic violence).  Drowning.  Fights.  Injuries.  Burns.  Damage to certain organs.  Having a baby with birth defects. ONE DRINK CAN BE TOO MUCH WHEN YOU ARE:  Working.  Pregnant or breastfeeding.  Taking medicines. Ask your doctor.  Driving or planning to drive. If you or someone you know has a drinking problem, get help from a doctor.  Document Released: 08/29/2009 Document Revised: 01/25/2012 Document Reviewed: 08/29/2009 ExitCare Patient Information 2013 [employer].   Smoking Hazards Smoking cigarettes is extremely bad for your health. Tobacco smoke has over 200 known poisons in it. There are over 60 chemicals in tobacco smoke that cause cancer. Some of the chemicals found in cigarette smoke include:   Cyanide.  Benzene.  Formaldehyde.  Methanol (wood alcohol).  Acetylene (fuel used in welding torches).  Ammonia. Cigarette smoke also contains the poisonous gases nitrogen oxide and carbon monoxide.  Cigarette smokers have an increased risk of many serious medical problems and Smoking causes approximately:  90% of all lung cancer deaths in men.  80% of all lung cancer deaths in women.  90% of deaths from chronic obstructive lung disease. Compared with nonsmokers, smoking increases the risk of:  Coronary heart disease by 2 to 4 times.  Stroke by 2 to 4 times.  Men developing lung cancer by 23 times.  Women developing lung cancer by 13 times.  Dying from chronic obstructive lung diseases by 12 times.  . Smoking is the most preventable cause of death and disease in our society.  WHY IS SMOKING ADDICTIVE?  Nicotine is the chemical  agent in tobacco that is capable of causing addiction or dependence.  When you smoke and inhale, nicotine is absorbed rapidly into the bloodstream through your lungs. Nicotine absorbed through the lungs is capable of creating a powerful addiction. Both inhaled and non-inhaled nicotine may be addictive.  Addiction studies of cigarettes and spit tobacco show that addiction to nicotine occurs mainly during the teen years, when young people begin using tobacco products. WHAT ARE THE BENEFITS OF QUITTING?  There are many health benefits to quitting smoking.   Likelihood of developing cancer and heart disease decreases. Health improvements are seen almost immediately.  Blood pressure, pulse rate, and breathing patterns start returning to normal soon after quitting. QUITTING SMOKING   American Lung Association - 1-800-LUNGUSA  American Cancer Society - 1-800-ACS-2345 Document Released: 12/10/2004 Document Revised: 01/25/2012 Document Reviewed: 08/14/2009 ExitCare Patient Information 2013 ExitCare,   LL[employer].   Stress Management Stress is a state of physical or mental tension that often results from changes in your life or normal routine. Some common causes of stress are:  Death of a loved one.  Injuries or severe illnesses.  Getting fired or changing jobs.  Moving into a new home. Other causes may be:  Sexual problems.  Business or financial losses.  Taking on a large debt.  Regular conflict with someone at home or at work.  Constant tiredness from lack of sleep. It is not just bad things that are stressful. It may be stressful to:  Win the lottery.  Get married.  Buy a new car. The amount of stress that can be easily tolerated varies from person to person. Changes generally cause stress, regardless of the types of change. Too much stress can affect your health. It may lead to physical or emotional problems. Too little stress (boredom) may also become stressful. SUGGESTIONS TO  REDUCE STRESS:  Talk things over with your family and friends. It often is helpful to share your concerns and worries. If you feel your problem is serious, you may want to get help from a professional counselor.  Consider your problems one at a time instead of lumping them all together. Trying to take care of everything at once may seem impossible. List all the things you need to do and then start with the most important one. Set a goal to accomplish 2 or 3 things each day. If you expect to do too many in a single day you will naturally fail, causing you to feel even more stressed.  Do not use alcohol or drugs to relieve stress. Although you may feel better for a short time, they do not remove the problems that caused the stress. They can also be habit forming.  Exercise regularly - at least 3 times per week. Physical exercise can help to relieve that "uptight" feeling and will relax you.  The shortest distance between despair and hope is often a good night's sleep.  Go to bed and get up on time allowing yourself time for appointments without being rushed.  Take a short "time-out" period from any stressful situation that occurs during the day. Close your eyes and take some deep breaths. Starting with the muscles in your face, tense them, hold it for a few seconds, then relax. Repeat this with the muscles in your neck, shoulders, hand, stomach, back and legs.  Take good care of yourself. Eat a balanced diet and get plenty of rest.  Schedule time for having fun. Take a break from your daily routine to relax. HOME CARE INSTRUCTIONS   Call if you feel overwhelmed by your problems and feel you can no longer manage them on your own.  Return immediately if you feel like hurting yourself or someone else. Document Released: 04/28/2001 Document Revised: 01/25/2012 Document Reviewed: 12/19/2007 [facility] Patient Information 2013 [employer].   Oral Contraception Use Oral contraceptive pills  (OCPs) are medicines taken to prevent pregnancy. OCPs work by preventing the ovaries from releasing eggs. The hormones in OCPs also cause the cervical mucus to thicken, preventing the sperm from entering the uterus. The hormones also cause the uterine lining to become thin, not allowing a fertilized egg to attach to the inside of the uterus. OCPs are highly effective when taken exactly as prescribed. However, OCPs do not prevent sexually transmitted diseases (STDs). Safe sex practices, such as using condoms along with an OCP, can help prevent STDs.  Before taking OCPs, you may have a physical exam and Pap test. Your health care provider may also order blood tests if necessary. Your health care provider will make sure you are a good candidate for oral contraception. Discuss with your health care provider the possible side effects of the OCP you may be prescribed. When starting an OCP, it can take 2 to 3 months for the body to adjust to the changes in hormone levels in your body.  HOW TO TAKE ORAL CONTRACEPTIVE PILLS Your health care provider may advise you on how to start taking the first cycle of OCPs. Otherwise, you can:   Start on day 1 of your menstrual period. You will not need any backup contraceptive protection with this start time.   Start on the first Sunday after your menstrual period or the day you get your prescription. In these cases, you will need to use backup contraceptive protection for the first week.   Start the pill at any time of your cycle. If you take the pill within 5 days of the start of your period, you are protected against pregnancy right away. In this case, you will not need a backup form of birth control. If you start at any other time of your menstrual cycle, you will need to use another form of birth control for 7 days. If your OCP is the type called a minipill, it will protect you from pregnancy after taking it for 2 days (48 hours). After you have started taking OCPs:    If you forget to take 1 pill, take it as soon as you remember. Take the next pill at the regular time.   If you miss 2 or more pills, call your health care provider because different pills have different instructions for missed doses. Use backup birth control until your next menstrual period starts.   If you use a 28-day pack that contains inactive pills and you miss 1 of the last 7 pills (pills with no hormones), it will not matter. Throw away the rest of the non-hormone pills and start a new pill pack.  No matter which day you start the OCP, you will always start a new pack on that same day of the week. Have an extra pack of OCPs and a backup contraceptive method available in case you miss some pills or lose your OCP pack.  HOME CARE INSTRUCTIONS   Do not smoke.   Always use a condom to protect against STDs. OCPs do not protect against STDs.   Use a calendar to mark your menstrual period days.   Read the information and directions that came with your OCP. Talk to your health care provider if you have questions.  SEEK MEDICAL CARE IF:   You develop nausea and vomiting.   You have abnormal vaginal discharge or bleeding.   You develop a rash.   You miss your menstrual period.   You are losing your hair.   You need treatment for mood swings or depression.   You get dizzy when taking the OCP.   You develop acne from taking the OCP.   You become pregnant.  SEEK IMMEDIATE MEDICAL CARE IF:   You develop chest pain.   You develop shortness of breath.   You have an uncontrolled or severe headache.   You develop numbness or slurred speech.   You develop visual problems.   You develop pain, redness, and swelling in the legs.    This information is not intended  to replace advice given to you by your health care provider. Make sure you discuss any questions you have with your health care provider.   Document Released: 10/22/2011 Document Revised:  11/23/2014 Document Reviewed: 04/23/2013 Elsevier Interactive Patient Education 2016 [employer]. Oral Contraception Information Oral contraceptive pills (OCPs) are medicines taken to prevent pregnancy. OCPs work by preventing the ovaries from releasing eggs. The hormones in OCPs also cause the cervical mucus to thicken, preventing the sperm from entering the uterus. The hormones also cause the uterine lining to become thin, not allowing a fertilized egg to attach to the inside of the uterus. OCPs are highly effective when taken exactly as prescribed. However, OCPs do not prevent sexually transmitted diseases (STDs). Safe sex practices, such as using condoms along with the pill, can help prevent STDs.  Before taking the pill, you may have a physical exam and Pap test. Your health care provider may order blood tests. The health care provider will make sure you are a good candidate for oral contraception. Discuss with your health care provider the possible side effects of the OCP you may be prescribed. When starting an OCP, it can take 2 to 3 months for the body to adjust to the changes in hormone levels in your body.  TYPES OF ORAL CONTRACEPTION  The combination pill--This pill contains estrogen and progestin (synthetic progesterone) hormones. The combination pill comes in 21-day, 28-day, or 91-day packs. Some types of combination pills are meant to be taken continuously (365-day pills). With 21-day packs, you do not take pills for 7 days after the last pill. With 28-day packs, the pill is taken every day. The last 7 pills are without hormones. Certain types of pills have more than 21 hormone-containing pills. With 91-day packs, the first 84 pills contain both hormones, and the last 7 pills contain no hormones or contain estrogen only.  The minipill--This pill contains the progesterone hormone only. The pill is taken every day continuously. It is very important to take the pill at the same time each day.  The minipill comes in packs of 28 pills. All 28 pills contain the hormone.  ADVANTAGES OF ORAL CONTRACEPTIVE PILLS  Decreases premenstrual symptoms.   Treats menstrual period cramps.   Regulates the menstrual cycle.   Decreases a heavy menstrual flow.   May treatacne, depending on the type of pill.   Treats abnormal uterine bleeding.   Treats polycystic ovarian syndrome.   Treats endometriosis.   Can be used as emergency contraception.  THINGS THAT CAN MAKE ORAL CONTRACEPTIVE PILLS LESS EFFECTIVE OCPs can be less effective if:   You forget to take the pill at the same time every day.   You have a stomach or intestinal disease that lessens the absorption of the pill.   You take OCPs with other medicines that make OCPs less effective, such as antibiotics, certain HIV medicines, and some seizure medicines.   You take expired OCPs.   You forget to restart the pill on day 7, when using the packs of 21 pills.  RISKS ASSOCIATED WITH ORAL CONTRACEPTIVE PILLS  Oral contraceptive pills can sometimes cause side effects, such as:  Headache.  Nausea.  Breast tenderness.  Irregular bleeding or spotting. Combination pills are also associated with a small increased risk of:  Blood clots.  Heart attack.  Stroke.   This information is not intended to replace advice given to you by your health care provider. Make sure you discuss any questions you have with your health  care provider.   Document Released: 01/23/2003 Document Revised: 08/23/2013 Document Reviewed: 04/23/2013 Elsevier Interactive Patient Education 2016 [place]   Schedule 3 month appointment for follow up on pills.

## 2015-11-21 NOTE — Progress Notes (Signed)
25 y.o. G0P0000 Single  Caucasian Fe here for annual exam. Periods normal. Recent TAB 10/25/15 with medication use and follow up with [place] confirmation. No condom use. Started period today, seems normal. Would like to start on OCP used before for cycle control. Emotionally OK. Had sister's support only with TAB.. Partner change.Desires STD screening. Sees Urgent Care if needed. No other health concerns. Senior at [employer]!  Patient's last menstrual period was 11/21/2015.          Sexually active: Yes.    The current method of family planning is condoms sometimes.    Exercising: Yes.    yoga & pilates Smoker:  no  Health Maintenance: Pap:  11-13-14 neg MMG:  none Colonoscopy:  none BMD:   none TDaP:  2013 Shingles: no Pneumonia: no Hep C and HIV: not done Labs: poct urine-neg, Hgb-12.9 Self breast exam: done monthly   reports that she has never smoked. She does not have any smokeless tobacco history on file. She reports that she drinks about 4.2 - 6.0 oz of alcohol per week. She reports that she does not use illicit drugs.  History reviewed. No pertinent past medical history.  Past Surgical History  Procedure Laterality Date  . Wisdom tooth extraction    . Tonsillectomy      Current Outpatient Prescriptions  Medication Sig Dispense Refill  . ibuprofen (ADVIL,MOTRIN) 800 MG tablet take 1 tablet by mouth every 8 hours if needed for pain  0   No current facility-administered medications for this visit.    Family History  Problem Relation Age of Onset  . Stroke Father     ROS:  Pertinent items are noted in HPI.  Otherwise, a comprehensive ROS was negative.  Exam:   BP 104/60 mmHg  Pulse 68  Resp 16  Ht 5' 2.5" (1.588 m)  Wt 140 lb (63.504 kg)  BMI 25.18 kg/m2  LMP 11/21/2015 Height: 5' 2.5" (158.8 cm) Ht Readings from Last 3 Encounters:  11/21/15 5' 2.5" (1.588 m)  11/13/14 5' 2.25" (1.581 m)  10/23/13 5' 2.5" (1.588 m)    General appearance: alert, cooperative and  appears stated age Head: Normocephalic, without obvious abnormality, atraumatic Neck: no adenopathy, supple, symmetrical, trachea midline and thyroid normal to inspection and palpation Lungs: clear to auscultation bilaterally Breasts: normal appearance, no masses or tenderness, No nipple retraction or dimpling, No nipple discharge or bleeding, No axillary or supraclavicular adenopathy Heart: regular rate and rhythm Abdomen: soft, non-tender; no masses,  no organomegaly Extremities: extremities normal, atraumatic, no cyanosis or edema Skin: Skin color, texture, turgor normal. No rashes or lesions Lymph nodes: Cervical, supraclavicular, and axillary nodes normal. No abnormal inguinal nodes palpated Neurologic: Grossly normal   Pelvic: External genitalia:  no lesions              Urethra:  normal appearing urethra with no masses, tenderness or lesions              Bartholin's and Skene's: normal                 Vagina: normal appearing vagina with normal color and discharge, no lesions              Cervix: normal appearance,no lesions, no tenderness              Pap taken: No. Bimanual Exam:  Uterus:  normal size, contour, position, consistency, mobility, non-tender              Adnexa:  normal adnexa and no mass, fullness, tenderness               Rectovaginal: Confirms               Anus:  normal appearance  Chaperone present: yes  A:  Well Woman with normal exam  Contraception condoms not consistent, desires OCP again  Unplanned pregnancy with recent TAB with medication and US follow up all normal per patient (no condom use)  Screening labs   P:   Reviewed health and wellness pertinent to exam  Discussed risks/benefits/side effects/warning signs/expectations of bleeding and importance of consistent use for prevention of pregnancy and cycle control. Questions addressed.   Rx Loestrin 24 Fe with instructions for use and printed information given.   Recheck 3 months for  evaluation  Discussed emotional support as needed with sister, patient feels Ok.  Labs:CBC,Hep.C, HSV 1,2,Gc,Chlamydia, RPR STD panel  Pap smear as above not taken   counseled on breast self exam, STD prevention, HIV risk factors and prevention, use and side effects of OCP's, adequate intake of calcium and vitamin D, diet and exercise  return annually or prn  An After Visit Summary was printed and given to the patient.

## 2015-11-22 LAB — HEPATITIS C ANTIBODY: HCV AB: NEGATIVE

## 2015-11-22 LAB — STD PANEL
HIV 1&2 Ab, 4th Generation: NONREACTIVE
Hepatitis B Surface Ag: NEGATIVE

## 2015-11-22 LAB — HSV(HERPES SIMPLEX VRS) I + II AB-IGG
HSV 1 GLYCOPROTEIN G AB, IGG: 7.01 IV — AB
HSV 2 Glycoprotein G Ab, IgG: 0.14 IV

## 2015-11-25 LAB — IPS N GONORRHOEA AND CHLAMYDIA BY PCR

## 2015-11-25 NOTE — Progress Notes (Signed)
Reviewed personally.  M. Suzanne Nikia Levels, MD.  

## 2015-12-19 ENCOUNTER — Encounter: Payer: Self-pay | Admitting: Certified Nurse Midwife

## 2015-12-19 ENCOUNTER — Telehealth: Payer: Self-pay

## 2015-12-19 NOTE — Telephone Encounter (Signed)
Spoke with patient regarding mychart message as seen below. She states that she started her first pack of Loestrin at the beginning of this month with the first day of her cycle. She denies missing any pills or taking any pills late. She reports she started her cycle on January 21st and is still bleeding. She denies any heavy bleeding. States she is changing her pad/tampon a "couple times" a day. Denies any feelings of fatigue, dizziness, or weakness. Advised it is not uncommon to have irregular bleeding as her body adjusts to being on a new birth control pill. Advised this is normal. Advised she will need to continue taking her pills at the same time every day and not to miss any pills. If bleeding increases to having to change her pad/tampon every hour for two hours she will need to be seen. If she develops and dizziness or fatigue she will also need to be seen. Advised it can take up to 3 months for her body to adjust to a new pill and BTB is common during this time. She is agreeable. Advised I will speak with Leota Sauers CNM and if she has any additional recommendations I will return call.  Non-Urgent Medical Question  Message [id number]   From  [name]   To  Verner Chol, CNM   Sent  12/19/2015 10:57 AM     I recently started on Loestrin, which I think may be the issue as of now. I started my menstrual cycle on January 21 and have still had continuous, sometimes heavy, bleeding ever since. It hasn't stopped yet which is concerning since this is not normal for me. My usual cycles are about 4 days. I recently had a termination as well, so that also may be why this is lasting longer. I just want to make sure that everything is okay or normal. Thank you.      Responsible Party    Pool - [facility] No one has taken responsibility for this message.     No actions have been taken on this message.

## 2015-12-19 NOTE — Telephone Encounter (Signed)
Agree with plan 

## 2015-12-19 NOTE — Telephone Encounter (Signed)
Telephone message created to discuss mychart message with patient. Please see encounter dated 12/19/2015.

## 2016-02-19 ENCOUNTER — Ambulatory Visit (INDEPENDENT_AMBULATORY_CARE_PROVIDER_SITE_OTHER): Payer: BLUE CROSS/BLUE SHIELD | Admitting: Certified Nurse Midwife

## 2016-02-19 VITALS — BP 110/60 | HR 72 | Resp 14 | Ht 62.0 in | Wt 144.8 lb

## 2016-02-19 DIAGNOSIS — Z3041 Encounter for surveillance of contraceptive pills: Secondary | ICD-10-CM

## 2016-02-19 NOTE — Progress Notes (Signed)
25 y.o. Single Caucasian G1P0010here for evaluation of Loestrin 24 Fe  initiated on 11/22/15 for contraception.Menses duration 5 days with moderate to light flow. Patient taking medication as prescribed. Denies missed pills, headaches, nausea, DVT warning signs or symptoms,  breakthrough bleeding, or other changes.   Keeping menses calendar. No other health issues today  O: Healthy female, WD WN Affect: normal orientation X 3    A: Contraception OCP follow up Loestrin 24 Fe working well, no contraindication to continuance  P: Continue Loestrin 24 Fe as prescribed has Rx  Reviewed warning signs and need to advise if problems   20 minutes spent with patient  in face to face counseling regarding OCP use.  RV aex , prn

## 2016-02-19 NOTE — Progress Notes (Signed)
Encounter reviewed Jill Jertson, MD   

## 2016-02-19 NOTE — Patient Instructions (Signed)

## 2016-03-13 ENCOUNTER — Telehealth: Payer: Self-pay | Admitting: Certified Nurse Midwife

## 2016-03-13 DIAGNOSIS — Z30011 Encounter for initial prescription of contraceptive pills: Secondary | ICD-10-CM

## 2016-03-13 MED ORDER — NORETHIN ACE-ETH ESTRAD-FE 1-20 MG-MCG(24) PO TABS: 1.0000 | ORAL_TABLET | Freq: Every day | ORAL | Status: DC

## 2016-03-13 NOTE — Telephone Encounter (Signed)
Patient's BC has been discontinued by her pharmacy, patient is wanting a alternative sent in. Best # to reach: [phone] [facility] [address]

## 2016-03-13 NOTE — Telephone Encounter (Signed)
Spoke with patient. Patient states that her pharmacy's distributor is no longer carrying her Loestrin 24 Fe birth control. Patient is requesting an alternative OCP. Advised patient another pharmacy may have this is stock and that way she will not have to change medications. She is agreeable and would like for me to contact pharmacies to check availability.   [facility] [address] in [place] who state they have generic Loestrin 24 Fe in stock.  Spoke with patient who states she has been taking the generic and would [facility] [address]. Rx for Loestrin 24 fe #1 [id number] [facility] [address]. She is agreeable and verbalizes understanding.  Routing to provider for final review. Patient agreeable to disposition. Will close encounter.

## 2016-09-22 ENCOUNTER — Telehealth: Payer: Self-pay | Admitting: Certified Nurse Midwife

## 2016-09-22 NOTE — Telephone Encounter (Signed)
Patient has moved and is trying to have her birth control transferred to another [facility]. [facility] to [facility] on [address] [place].

## 2016-09-22 NOTE — Telephone Encounter (Signed)
Left message for pt. She needs to call new pharmacy and have them request Rx from old pharmacy.

## 2016-11-26 ENCOUNTER — Ambulatory Visit (INDEPENDENT_AMBULATORY_CARE_PROVIDER_SITE_OTHER): Payer: BLUE CROSS/BLUE SHIELD | Admitting: Certified Nurse Midwife

## 2016-11-26 ENCOUNTER — Encounter: Payer: Self-pay | Admitting: Certified Nurse Midwife

## 2016-11-26 VITALS — BP 104/68 | HR 68 | Resp 16 | Ht 62.25 in | Wt 153.0 lb

## 2016-11-26 DIAGNOSIS — Z0000 Encounter for general adult medical examination without abnormal findings: Secondary | ICD-10-CM

## 2016-11-26 DIAGNOSIS — Z124 Encounter for screening for malignant neoplasm of cervix: Secondary | ICD-10-CM | POA: Diagnosis not present

## 2016-11-26 DIAGNOSIS — Z3041 Encounter for surveillance of contraceptive pills: Secondary | ICD-10-CM

## 2016-11-26 DIAGNOSIS — R195 Other fecal abnormalities: Secondary | ICD-10-CM

## 2016-11-26 DIAGNOSIS — Z01419 Encounter for gynecological examination (general) (routine) without abnormal findings: Secondary | ICD-10-CM | POA: Diagnosis not present

## 2016-11-26 LAB — POCT URINALYSIS DIPSTICK
BILIRUBIN UA: NEGATIVE
Glucose, UA: NEGATIVE
KETONES UA: NEGATIVE
LEUKOCYTES UA: NEGATIVE
Nitrite, UA: NEGATIVE
PH UA: 5
Protein, UA: NEGATIVE
RBC UA: NEGATIVE
Urobilinogen, UA: NEGATIVE

## 2016-11-26 LAB — HEMOGLOBIN, FINGERSTICK: Hemoglobin, fingerstick: 12.5 g/dL (ref 12.0–15.0)

## 2016-11-26 MED ORDER — NORETHIN ACE-ETH ESTRAD-FE 1-20 MG-MCG(24) PO TABS: 1.0000 | ORAL_TABLET | Freq: Every day | ORAL | 12 refills | Status: DC

## 2016-11-26 NOTE — Progress Notes (Signed)
26 y.o. G1P0010 Single  Caucasian Fe here for annual exam. Periods normal, no issues. Contraception OCP working well. New partner. STD screening desired. Has noted softer stools since move to city and using city water. No cramping or foul stool, just slightly different.Denies travel out of country, fever or chills. Taking probiotic now to see if this will help. Denies blood in stool or black tarry stools. Has moved to [place] now, working with [employer]. Currently has no contact with mother(patient here), but visits Dad and other siblings. Emotionally doing well. No other health issues today.     Patient's last menstrual period was 11/19/2016 (exact date).          Sexually active: Yes.    The current method of family planning is OCP (estrogen/progesterone).    Exercising: Yes.    yoga Smoker:  no  Health Maintenance: Pap:  11-13-14 neg MMG:  none Colonoscopy: none BMD:  none TDaP:  2013 Shingles: no Pneumonia: no Hep C and HIV: both neg 2017 Labs: poct urine-neg, hgb-12.5 Self breast exam: pt checks them   reports that she has never smoked. She has never used smokeless tobacco. She reports that she drinks about 3.0 - 6.0 oz of alcohol per week . She reports that she does not use drugs.  History reviewed. No pertinent past medical history.  Past Surgical History:  Procedure Laterality Date  . TONSILLECTOMY    . WISDOM TOOTH EXTRACTION      Current Outpatient Prescriptions  Medication Sig Dispense Refill  . Norethindrone Acetate-Ethinyl Estrad-FE (LOESTRIN 24 FE) 1-20 MG-MCG(24) tablet Take 1 tablet by mouth daily. 1 Package 11   No current facility-administered medications for this visit.     Family History  Problem Relation Age of Onset  . Stroke Father     ROS:  Pertinent items are noted in HPI.  Otherwise, a comprehensive ROS was negative.  Exam:   BP 104/68   Pulse 68   Resp 16   Ht 5' 2.25" (1.581 m)   Wt 153 lb (69.4 kg)   LMP 11/19/2016 (Exact Date)   BMI  27.76 kg/m  Height: 5' 2.25" (158.1 cm) Ht Readings from Last 3 Encounters:  11/26/16 5' 2.25" (1.581 m)  02/19/16 5\' 2"  (1.575 m)  11/21/15 5' 2.5" (1.588 m)    General appearance: alert, cooperative and appears stated age Head: Normocephalic, without obvious abnormality, atraumatic Neck: no adenopathy, supple, symmetrical, trachea midline and thyroid normal to inspection and palpation Lungs: clear to auscultation bilaterally Breasts: normal appearance, no masses or tenderness, No nipple retraction or dimpling, No nipple discharge or bleeding, No axillary or supraclavicular adenopathy Heart: regular rate and rhythm Abdomen: soft, non-tender; no masses,  no organomegaly, normal bowel sounds all 4 quadrants, no rebound Extremities: extremities normal, atraumatic, no cyanosis or edema Skin: Skin color, texture, turgor normal. No rashes or lesions, warm and dry Lymph nodes: Cervical, supraclavicular, and axillary nodes normal. No abnormal inguinal nodes palpated Neurologic: Grossly normal   Pelvic: External genitalia:  no lesions              Urethra:  normal appearing urethra with no masses, tenderness or lesions              Bartholin's and Skene's: normal                 Vagina: normal appearing vagina with normal color and discharge, no lesions  Cervix: no bleeding following Pap, no cervical motion tenderness, no lesions and nulliparous appearance              Pap taken: Yes.   Bimanual Exam:  Uterus:  normal size, contour, position, consistency, mobility, non-tender and anteverted              Adnexa: normal adnexa and no mass, fullness, tenderness               Rectovaginal: Confirms               Anus:  normal appearance  Chaperone present: yes  A:  Well Woman with normal exam  Contraception OCP desired  Change in stool consistency with move to new area, on probiotics now  Screening labs  P:   Reviewed health and wellness pertinent to exam  Rx Loestrin 24 Fe  see order  Discussed warning signs with stool change and needs to advise if has diarrhea or foul smelling, black tarry  Or blood in stools. Agree with probiotic trial and increase fiber in diet. Patient will advise if no change questions addressed.  Lab: HIV,RPR, GC,Chlamydia, Affirm  Pap smear as above   counseled on breast self exam, STD prevention, HIV risk factors and prevention, use and side effects of OCP's, adequate intake of calcium and vitamin D, diet and exercise  return annually or prn  An After Visit Summary was printed and given to the patient.

## 2016-11-26 NOTE — Patient Instructions (Signed)
General topics  Next pap or exam is  due in 1 year Take a Women's multivitamin Take 1200 mg. of calcium daily - prefer dietary If any concerns in interim to call back  Breast Self-Awareness Practicing breast self-awareness may pick up problems early, prevent significant medical complications, and possibly save your life. By practicing breast self-awareness, you can become familiar with how your breasts look and feel and if your breasts are changing. This allows you to notice changes early. It can also offer you some reassurance that your breast health is good. One way to learn what is normal for your breasts and whether your breasts are changing is to do a breast self-exam. If you find a lump or something that was not present in the past, it is best to contact your caregiver right away. Other findings that should be evaluated by your caregiver include nipple discharge, especially if it is bloody; skin changes or reddening; areas where the skin seems to be pulled in (retracted); or new lumps and bumps. Breast pain is seldom associated with cancer (malignancy), but should also be evaluated by a caregiver. BREAST SELF-EXAM The best time to examine your breasts is 5 7 days after your menstrual period is over.  ExitCare Patient Information 2013 [employer].   Exercise to Stay Healthy Exercise helps you become and stay healthy. EXERCISE IDEAS AND TIPS Choose exercises that:  You enjoy.  Fit into your day. You do not need to exercise really hard to be healthy. You can do exercises at a slow or medium level and stay healthy. You can:  Stretch before and after working out.  Try yoga, Pilates, or tai chi.  Lift weights.  Walk fast, swim, jog, run, climb stairs, bicycle, dance, or rollerskate.  Take aerobic classes. Exercises that burn about 150 calories:  Running 1  miles in 15 minutes.  Playing volleyball for 45 to 60 minutes.  Washing and waxing a car for 45 to 60  minutes.  Playing touch football for 45 minutes.  Walking 1  miles in 35 minutes.  Pushing a stroller 1  miles in 30 minutes.  Playing basketball for 30 minutes.  Raking leaves for 30 minutes.  Bicycling 5 miles in 30 minutes.  Walking 2 miles in 30 minutes.  Dancing for 30 minutes.  Shoveling snow for 15 minutes.  Swimming laps for 20 minutes.  Walking up stairs for 15 minutes.  Bicycling 4 miles in 15 minutes.  Gardening for 30 to 45 minutes.  Jumping rope for 15 minutes.  Washing windows or floors for 45 to 60 minutes. Document Released: 12/05/2010 Document Revised: 01/25/2012 Document Reviewed: 12/05/2010 [employer] Patient Information 2013 [employer].   Other topics ( that may be useful information):    Sexually Transmitted Disease Sexually transmitted disease (STD) refers to any infection that is passed from person to person during sexual activity. This may happen by way of saliva, semen, blood, vaginal mucus, or urine. Common STDs include:  Gonorrhea.  Chlamydia.  Syphilis.  HIV/AIDS.  Genital herpes.  Hepatitis B and C.  Trichomonas.  Human papillomavirus (HPV).  Pubic lice. CAUSES  An STD may be spread by bacteria, virus, or parasite. A person can get an STD by:  Sexual intercourse with an infected person.  Sharing sex toys with an infected person.  Sharing needles with an infected person.  Having intimate contact with the genitals, mouth, or rectal areas of an infected person. SYMPTOMS  Some people may not have any symptoms, but   they can still pass the infection to others. Different STDs have different symptoms. Symptoms include:  Painful or bloody urination.  Pain in the pelvis, abdomen, vagina, anus, throat, or eyes.  Skin rash, itching, irritation, growths, or sores (lesions). These usually occur in the genital or anal area.  Abnormal vaginal discharge.  Penile discharge in men.  Soft, flesh-colored skin growths in the  genital or anal area.  Fever.  Pain or bleeding during sexual intercourse.  Swollen glands in the groin area.  Yellow skin and eyes (jaundice). This is seen with hepatitis. DIAGNOSIS  To make a diagnosis, your caregiver may:  Take a medical history.  Perform a physical exam.  Take a specimen (culture) to be examined.  Examine a sample of discharge under a microscope.  Perform blood test TREATMENT   Chlamydia, gonorrhea, trichomonas, and syphilis can be cured with antibiotic medicine.  Genital herpes, hepatitis, and HIV can be treated, but not cured, with prescribed medicines. The medicines will lessen the symptoms.  Genital warts from HPV can be treated with medicine or by freezing, burning (electrocautery), or surgery. Warts may come back.  HPV is a virus and cannot be cured with medicine or surgery.However, abnormal areas may be followed very closely by your caregiver and may be removed from the cervix, vagina, or vulva through office procedures or surgery. If your diagnosis is confirmed, your recent sexual partners need treatment. This is true even if they are symptom-free or have a negative culture or evaluation. They should not have sex until their caregiver says it is okay. HOME CARE INSTRUCTIONS  All sexual partners should be informed, tested, and treated for all STDs.  Take your antibiotics as directed. Finish them even if you start to feel better.  Only take over-the-counter or prescription medicines for pain, discomfort, or fever as directed by your caregiver.  Rest.  Eat a balanced diet and drink enough fluids to keep your urine clear or pale yellow.  Do not have sex until treatment is completed and you have followed up with your caregiver. STDs should be checked after treatment.  Keep all follow-up appointments, Pap tests, and blood tests as directed by your caregiver.  Only use latex condoms and water-soluble lubricants during sexual activity. Do not use  petroleum jelly or oils.  Avoid alcohol and illegal drugs.  Get vaccinated for HPV and hepatitis. If you have not received these vaccines in the past, talk to your caregiver about whether one or both might be right for you.  Avoid risky sex practices that can break the skin. The only way to avoid getting an STD is to avoid all sexual activity.Latex condoms and dental dams (for oral sex) will help lessen the risk of getting an STD, but will not completely eliminate the risk. SEEK MEDICAL CARE IF:   You have a fever.  You have any new or worsening symptoms. Document Released: 01/23/2003 Document Revised: 01/25/2012 Document Reviewed: 01/30/2011 [facility] Patient Information 2013 [employer].    Domestic Abuse You are being battered or abused if someone close to you hits, pushes, or physically hurts you in any way. You also are being abused if you are forced into activities. You are being sexually abused if you are forced to have sexual contact of any kind. You are being emotionally abused if you are made to feel worthless or if you are constantly threatened. It is important to remember that help is available. No one has the right to abuse you. PREVENTION OF FURTHER  ABUSE  Learn the warning signs of danger. This varies with situations but may include: the use of alcohol, threats, isolation from friends and family, or forced sexual contact. Leave if you feel that violence is going to occur.  If you are attacked or beaten, report it to the police so the abuse is documented. You do not have to press charges. The police can protect you while you or the attackers are leaving. Get the officer's name and badge number and a copy of the report.  Find someone you can trust and tell them what is happening to you: your caregiver, a nurse, clergy member, close friend or family member. Feeling ashamed is natural, but remember that you have done nothing wrong. No one deserves abuse. Document Released:  10/30/2000 Document Revised: 01/25/2012 Document Reviewed: 01/08/2011 [employer] Patient Information 2013 [employer].    How Much is Too Much Alcohol? Drinking too much alcohol can cause injury, accidents, and health problems. These types of problems can include:   Car crashes.  Falls.  Family fighting (domestic violence).  Drowning.  Fights.  Injuries.  Burns.  Damage to certain organs.  Having a baby with birth defects. ONE DRINK CAN BE TOO MUCH WHEN YOU ARE:  Working.  Pregnant or breastfeeding.  Taking medicines. Ask your doctor.  Driving or planning to drive. If you or someone you know has a drinking problem, get help from a doctor.  Document Released: 08/29/2009 Document Revised: 01/25/2012 Document Reviewed: 08/29/2009 Exit[employer] Patient Information 2013 [employer].   Smoking Hazards Smoking cigarettes is extremely bad for your health. Tobacco smoke has over 200 known poisons in it. There are over 60 chemicals in tobacco smoke that cause cancer. Some of the chemicals found in cigarette smoke include:   Cyanide.  Benzene.  Formaldehyde.  Methanol (wood alcohol).  Acetylene (fuel used in welding torches).  Ammonia. Cigarette smoke also contains the poisonous gases nitrogen oxide and carbon monoxide.  Cigarette smokers have an increased risk of many serious medical problems and Smoking causes approximately:  90% of all lung cancer deaths in men.  80% of all lung cancer deaths in women.  90% of deaths from chronic obstructive lung disease. Compared with nonsmokers, smoking increases the risk of:  Coronary heart disease by 2 to 4 times.  Stroke by 2 to 4 times.  Men developing lung cancer by 23 times.  Women developing lung cancer by 13 times.  Dying from chronic obstructive lung diseases by 12 times.  . Smoking is the most preventable cause of death and disease in our society.  WHY IS SMOKING ADDICTIVE?  Nicotine is the chemical  agent in tobacco that is capable of causing addiction or dependence.  When you smoke and inhale, nicotine is absorbed rapidly into the bloodstream through your lungs. Nicotine absorbed through the lungs is capable of creating a powerful addiction. Both inhaled and non-inhaled nicotine may be addictive.  Addiction studies of cigarettes and spit tobacco show that addiction to nicotine occurs mainly during the teen years, when young people begin using tobacco products. WHAT ARE THE BENEFITS OF QUITTING?  There are many health benefits to quitting smoking.   Likelihood of developing cancer and heart disease decreases. Health improvements are seen almost immediately.  Blood pressure, pulse rate, and breathing patterns start returning to normal soon after quitting. QUITTING SMOKING   American Lung Association - 1-800-LUNGUSA  American Cancer Society - 1-800-ACS-2345 Document Released: 12/10/2004 Document Revised: 01/25/2012 Document Reviewed: 08/14/2009 Exit[employer] Patient Information 2013 [employer].   Stress Management Stress is a state of physical or mental tension that often results from changes in your life or normal routine. Some common causes of stress are:  Death of a loved one.  Injuries or severe illnesses.  Getting fired or changing jobs.  Moving into a new home. Other causes may be:  Sexual problems.  Business or financial losses.  Taking on a large debt.  Regular conflict with someone at home or at work.  Constant tiredness from lack of sleep. It is not just bad things that are stressful. It may be stressful to:  Win the lottery.  Get married.  Buy a new car. The amount of stress that can be easily tolerated varies from person to person. Changes generally cause stress, regardless of the types of change. Too much stress can affect your health. It may lead to physical or emotional problems. Too little stress (boredom) may also become stressful. SUGGESTIONS TO  REDUCE STRESS:  Talk things over with your family and friends. It often is helpful to share your concerns and worries. If you feel your problem is serious, you may want to get help from a professional counselor.  Consider your problems one at a time instead of lumping them all together. Trying to take care of everything at once may seem impossible. List all the things you need to do and then start with the most important one. Set a goal to accomplish 2 or 3 things each day. If you expect to do too many in a single day you will naturally fail, causing you to feel even more stressed.  Do not use alcohol or drugs to relieve stress. Although you may feel better for a short time, they do not remove the problems that caused the stress. They can also be habit forming.  Exercise regularly - at least 3 times per week. Physical exercise can help to relieve that "uptight" feeling and will relax you.  The shortest distance between despair and hope is often a good night's sleep.  Go to bed and get up on time allowing yourself time for appointments without being rushed.  Take a short "time-out" period from any stressful situation that occurs during the day. Close your eyes and take some deep breaths. Starting with the muscles in your face, tense them, hold it for a few seconds, then relax. Repeat this with the muscles in your neck, shoulders, hand, stomach, back and legs.  Take good care of yourself. Eat a balanced diet and get plenty of rest.  Schedule time for having fun. Take a break from your daily routine to relax. HOME CARE INSTRUCTIONS   Call if you feel overwhelmed by your problems and feel you can no longer manage them on your own.  Return immediately if you feel like hurting yourself or someone else. Document Released: 04/28/2001 Document Revised: 01/25/2012 Document Reviewed: 12/19/2007 Exit[employer] Patient Information 2013 [employer].   

## 2016-11-27 LAB — WET PREP BY MOLECULAR PROBE
Candida species: NEGATIVE
Gardnerella vaginalis: NEGATIVE
Trichomonas vaginosis: NEGATIVE

## 2016-11-27 LAB — RPR

## 2016-11-27 LAB — HIV ANTIBODY (ROUTINE TESTING W REFLEX): HIV 1&2 Ab, 4th Generation: NONREACTIVE

## 2016-11-28 NOTE — Progress Notes (Signed)
Encounter reviewed Jill Jertson, MD   

## 2016-11-30 LAB — IPS N GONORRHOEA AND CHLAMYDIA BY PCR

## 2016-11-30 LAB — IPS PAP SMEAR ONLY

## 2016-12-04 ENCOUNTER — Telehealth: Payer: Self-pay

## 2016-12-04 NOTE — Telephone Encounter (Signed)
Pt returned your call.  

## 2016-12-04 NOTE — Telephone Encounter (Signed)
lmtcb

## 2016-12-04 NOTE — Telephone Encounter (Signed)
-----   Message from Verner Choleborah S Leonard, CNM sent at 12/02/2016  1:43 PM EST ----- Notify GC,Chlamydia are negative Pap smear ASCUS, no further evaluation at this time due to age. Will need repeat in one year 08

## 2016-12-08 NOTE — Telephone Encounter (Signed)
Left message to call back  

## 2016-12-08 NOTE — Telephone Encounter (Signed)
Spoke with patient, advised of results and recommendations as seen below per Leota Sauerseborah Leonard, CNM. Patient verbalizes understanding and is agreeable.  Routing to provider for final review. Patient is agreeable to disposition. Will close encounter.  Cc: Araceli Boucheavina Johnson, CMA

## 2017-12-02 ENCOUNTER — Ambulatory Visit: Payer: BLUE CROSS/BLUE SHIELD | Admitting: Certified Nurse Midwife

## 2017-12-07 ENCOUNTER — Other Ambulatory Visit: Payer: Self-pay | Admitting: Certified Nurse Midwife

## 2017-12-07 DIAGNOSIS — Z3041 Encounter for surveillance of contraceptive pills: Secondary | ICD-10-CM

## 2017-12-07 NOTE — Telephone Encounter (Signed)
Medication refill request: OCP Last AEX:  11/26/16 DL Next AEX: 1/61/091/24/19 Last MMG (if hormonal medication request): n/a Refill authorized: 11/26/16 #1 w/12 refills; today please advise

## 2017-12-09 ENCOUNTER — Other Ambulatory Visit (HOSPITAL_COMMUNITY)
Admission: RE | Admit: 2017-12-09 | Discharge: 2017-12-09 | Disposition: A | Discharge status: Home / Self Care | Payer: BLUE CROSS/BLUE SHIELD | Source: Ambulatory Visit | Attending: Obstetrics & Gynecology | Admitting: Obstetrics & Gynecology

## 2017-12-09 ENCOUNTER — Other Ambulatory Visit: Payer: Self-pay

## 2017-12-09 ENCOUNTER — Ambulatory Visit: Payer: BLUE CROSS/BLUE SHIELD | Admitting: Certified Nurse Midwife

## 2017-12-09 ENCOUNTER — Encounter: Payer: Self-pay | Admitting: Certified Nurse Midwife

## 2017-12-09 VITALS — BP 118/80 | HR 64 | Resp 16 | Ht 62.5 in | Wt 156.0 lb

## 2017-12-09 DIAGNOSIS — Z113 Encounter for screening for infections with a predominantly sexual mode of transmission: Secondary | ICD-10-CM

## 2017-12-09 DIAGNOSIS — Z124 Encounter for screening for malignant neoplasm of cervix: Secondary | ICD-10-CM | POA: Diagnosis not present

## 2017-12-09 DIAGNOSIS — Z3041 Encounter for surveillance of contraceptive pills: Secondary | ICD-10-CM

## 2017-12-09 DIAGNOSIS — Z8742 Personal history of other diseases of the female genital tract: Secondary | ICD-10-CM

## 2017-12-09 DIAGNOSIS — Z87898 Personal history of other specified conditions: Secondary | ICD-10-CM | POA: Insufficient documentation

## 2017-12-09 DIAGNOSIS — Z01419 Encounter for gynecological examination (general) (routine) without abnormal findings: Secondary | ICD-10-CM | POA: Diagnosis not present

## 2017-12-09 MED ORDER — NORETHIN ACE-ETH ESTRAD-FE 1-20 MG-MCG(24) PO TABS: 1.0000 | ORAL_TABLET | Freq: Every day | ORAL | 12 refills | Status: DC

## 2017-12-09 NOTE — Progress Notes (Signed)
27 y.o. G1P0010 Single  Caucasian Fe here for annual exam. Periods normal, no issues. Contraception working well. Complaining of loose stools after eating and occasionally other times. No blood in stools or tarry stools. Normal other times.  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 Does not feel she needs to see GI. Will be moving out on her own soon and feels this will resolve. New partner, STD screening desired, vaginal only. No other  Health issues today.  Patient's last menstrual period was 11/16/2017 (exact date).          Sexually active: Yes.    The current method of family planning is OCP (estrogen/progesterone).    2Exercising: Yes.    walking, lifting Smoker:  no  Health Maintenance: Pap:  11-13-14 neg, 11-26-16 ASCUS History of Abnormal Pap: yes MMG:  none Self Breast exams: yes Colonoscopy:  none BMD:   none TDaP:  2013 Shingles: no Pneumonia: no Hep C and HIV: hep c neg 2017, HIV neg 2018 Labs: no   reports that  has never smoked. she has never used smokeless tobacco. She reports that she drinks about 3.0 - 6.0 oz of alcohol per week. She reports that she does not use drugs.  History reviewed. No pertinent past medical history.  Past Surgical History:  Procedure Laterality Date  . TONSILLECTOMY    . WISDOM TOOTH EXTRACTION      Current Outpatient Medications  Medication Sig Dispense Refill  . JUNEL FE 24 1-20 MG-MCG(24) tablet TAKE 1 TABLET BY MOUTH DAILY 28 tablet 0   No current facility-administered medications for this visit.     Family History  Problem Relation Age of Onset  . Stroke Father     ROS:  Pertinent items are noted in HPI.  Otherwise, a comprehensive ROS was negative.  Exam:   BP 118/80   Pulse 64   Resp 16   Ht 5' 2.5" (1.588 m)   Wt 156 lb (70.8 kg)   LMP 11/16/2017 (Exact Date)   BMI 28.08 kg/m  Height: 5' 2.5" (158.8 cm) Ht Readings from Last 3 Encounters:  12/09/17 5' 2.5" (1.588 m)  11/26/16 5'  2.25" (1.581 m)  02/19/16 5\' 2"  (1.575 m)    General appearance: alert, cooperative and appears stated age Head: Normocephalic, without obvious abnormality, atraumatic Neck: no adenopathy, supple, symmetrical, trachea midline and thyroid normal to inspection and palpation Lungs: clear to auscultation bilaterally Breasts: normal appearance, no masses or tenderness, No nipple retraction or dimpling, No nipple discharge or bleeding, No axillary or supraclavicular adenopathy Heart: regular rate and rhythm Abdomen: soft, non-tender; no masses,  no organomegaly Extremities: extremities normal, atraumatic, no cyanosis or edema Skin: Skin color, texture, turgor normal. No rashes or lesions Lymph nodes: Cervical, supraclavicular, and axillary nodes normal. No abnormal inguinal nodes palpated Neurologic: Grossly normal   Pelvic: External genitalia:  no lesions              Urethra:  normal appearing urethra with no masses, tenderness or lesions              Bartholin's and Skene's: normal                 Vagina: normal appearing vagina with normal color and discharge, no lesions              Cervix: no cervical motion tenderness, no lesions and nulliparous appearance  Pap taken: Yes.   Bimanual Exam:  Uterus:  normal size, contour, position, consistency, mobility, non-tender and anteverted              Adnexa: normal adnexa and no mass, fullness, tenderness               Rectovaginal: Confirms               Anus:  normal sphincter tone, no lesions  Chaperone present: yes  A:  Well Woman with normal exam  Contraception OCP desired  STD screening  ? Stress related loose stools  ASCUS pap follow up  P:   Reviewed health and wellness pertinent to exam  Risks/benefits/warning signs of OCP reviewed, desires continuance  Rx Loestrin 24 Fe see order with instructions  Labs: Affirm, Vaginal screen  Discussed starting on oral probiotic daily to see if helps with stress gut. Warning  signs with stools given and need to evaluate if occurs.  Pap smear: yes if normal repeat in one year   counseled on breast self exam, STD prevention, HIV risk factors and prevention, use and side effects of OCP's, adequate intake of calcium and vitamin D  return annually or prn  An After Visit Summary was printed and given to the patient.

## 2017-12-10 LAB — VAGINITIS/VAGINOSIS, DNA PROBE
Candida Species: NEGATIVE
Gardnerella vaginalis: NEGATIVE
Trichomonas vaginosis: NEGATIVE

## 2017-12-10 LAB — CYTOLOGY - PAP: Diagnosis: NEGATIVE

## 2017-12-11 LAB — CHLAMYDIA/GC NAA, CONFIRMATION
CHLAMYDIA TRACHOMATIS, NAA: NEGATIVE
Neisseria gonorrhoeae, NAA: NEGATIVE

## 2018-04-25 ENCOUNTER — Telehealth: Payer: Self-pay | Admitting: Certified Nurse Midwife

## 2018-04-25 ENCOUNTER — Encounter: Payer: Self-pay | Admitting: Certified Nurse Midwife

## 2018-04-25 NOTE — Telephone Encounter (Signed)
Patient sent the following correspondence through MyChart. Routing to triage to assist patient with request.  ----- Message from Mychart, Generic sent at 04/25/2018 3:10 PM EDT -----    Good afternoon,  My brother is about to have a baby soon and I wanted to make sure all of my shots, especially Tdap is up to date. Please let me know if I need to make an appointment to have any immunizations done soon.   Thanks!

## 2018-04-25 NOTE — Telephone Encounter (Signed)
Call to patient. Left message on voice mail, per DPR and confirmed on phone log.  Per Epic,  TDAP last in 2013 and is up to date.  Left message to call back if any additional questions.

## 2018-12-20 ENCOUNTER — Other Ambulatory Visit: Payer: Self-pay

## 2018-12-20 ENCOUNTER — Other Ambulatory Visit (HOSPITAL_COMMUNITY)
Admission: RE | Admit: 2018-12-20 | Discharge: 2018-12-20 | Disposition: A | Discharge status: Home / Self Care | Payer: BLUE CROSS/BLUE SHIELD | Source: Ambulatory Visit | Attending: Obstetrics & Gynecology | Admitting: Obstetrics & Gynecology

## 2018-12-20 ENCOUNTER — Ambulatory Visit (INDEPENDENT_AMBULATORY_CARE_PROVIDER_SITE_OTHER): Payer: BLUE CROSS/BLUE SHIELD | Admitting: Certified Nurse Midwife

## 2018-12-20 ENCOUNTER — Encounter: Payer: Self-pay | Admitting: Certified Nurse Midwife

## 2018-12-20 VITALS — BP 110/72 | HR 68 | Resp 16 | Ht 62.5 in | Wt 170.0 lb

## 2018-12-20 DIAGNOSIS — Z01419 Encounter for gynecological examination (general) (routine) without abnormal findings: Secondary | ICD-10-CM

## 2018-12-20 DIAGNOSIS — Z124 Encounter for screening for malignant neoplasm of cervix: Secondary | ICD-10-CM

## 2018-12-20 DIAGNOSIS — Z3041 Encounter for surveillance of contraceptive pills: Secondary | ICD-10-CM | POA: Diagnosis not present

## 2018-12-20 DIAGNOSIS — Z113 Encounter for screening for infections with a predominantly sexual mode of transmission: Secondary | ICD-10-CM

## 2018-12-20 MED ORDER — NORETHIN ACE-ETH ESTRAD-FE 1-20 MG-MCG(24) PO TABS: 1.0000 | ORAL_TABLET | Freq: Every day | ORAL | 12 refills | Status: DC

## 2018-12-20 NOTE — Progress Notes (Signed)
28 y.o. G1P0010 Single  Caucasian Fe here for annual exam. Contraception working well, no warning signs. Periods monthly, duration 4 days one day of heavy bleeding, minimal cramping, then light. No issues with. Occasional alcohol use. Partner change, STD screening requested. Aware she has gained weight and is planning to work on. No health issues. She and her Dad went to [place] in 2019!  Patient's last menstrual period was 12/05/2018 (exact date).          Sexually active: Yes.    The current method of family planning is OCP (estrogen/progesterone).    Exercising: Yes.    yoga Smoker:  no  Review of Systems  Constitutional: Negative.   HENT: Negative.   Eyes: Negative.   Respiratory: Negative.   Cardiovascular: Negative.   Gastrointestinal: Negative.   Genitourinary: Negative.   Musculoskeletal: Negative.   Skin: Negative.   Neurological: Negative.   Endo/Heme/Allergies: Negative.   Psychiatric/Behavioral: Negative.     Health Maintenance: Pap:  11-26-16 ASCUS, 12-09-17 neg History of Abnormal Pap: yes MMG:  none Self Breast exams: yes Colonoscopy:  none BMD:   none TDaP:  2013 Shingles: no Pneumonia: no Hep C and HIV: hep c neg 2017, HIV neg 2018 Labs: yes   reports that she has never smoked. She has never used smokeless tobacco. She reports current alcohol use of about 5.0 standard drinks of alcohol per week. She reports that she does not use drugs.  History reviewed. No pertinent past medical history.  Past Surgical History:  Procedure Laterality Date  . TONSILLECTOMY    . WISDOM TOOTH EXTRACTION      Current Outpatient Medications  Medication Sig Dispense Refill  . Norethindrone Acetate-Ethinyl Estrad-FE (JUNEL FE 24) 1-20 MG-MCG(24) tablet Take 1 tablet by mouth daily. 28 tablet 12   No current facility-administered medications for this visit.     Family History  Problem Relation Age of Onset  . Stroke Father     ROS:  Pertinent items are noted in HPI.   Otherwise, a comprehensive ROS was negative.  Exam:   BP 110/72   Pulse 68   Resp 16   Ht 5' 2.5" (1.588 m)   Wt 170 lb (77.1 kg)   LMP 12/05/2018 (Exact Date)   BMI 30.60 kg/m  Height: 5' 2.5" (158.8 cm) Ht Readings from Last 3 Encounters:  12/20/18 5' 2.5" (1.588 m)  12/09/17 5' 2.5" (1.588 m)  11/26/16 5' 2.25" (1.581 m)    General appearance: alert, cooperative and appears stated age Head: Normocephalic, without obvious abnormality, atraumatic Neck: no adenopathy, supple, symmetrical, trachea midline and thyroid normal to inspection and palpation Lungs: clear to auscultation bilaterally Breasts: normal appearance, no masses or tenderness, No nipple retraction or dimpling, No nipple discharge or bleeding, No axillary or supraclavicular adenopathy Heart: regular rate and rhythm Abdomen: soft, non-tender; no masses,  no organomegaly Extremities: extremities normal, atraumatic, no cyanosis or edema Skin: Skin color, texture, turgor normal. No rashes or lesions Lymph nodes: Cervical, supraclavicular, and axillary nodes normal. No abnormal inguinal nodes palpated Neurologic: Grossly normal   Pelvic: External genitalia:  no lesions              Urethra:  normal appearing urethra with no masses, tenderness or lesions              Bartholin's and Skene's: normal                 Vagina: normal appearing vagina with normal color and discharge,  no lesions              Cervix: no cervical motion tenderness, no lesions and nulliparous appearance              Pap taken: Yes.   Bimanual Exam:  Uterus:  normal size, contour, position, consistency, mobility, non-tender and anteverted              Adnexa: normal adnexa and no mass, fullness, tenderness               Rectovaginal: Confirms               Anus:  normal sphincter tone, no lesions  Chaperone present: yes  A:  Well Woman with normal exam  Contraception OCP desired  Weight gain  History of Abnormal pap smear  STD  screening  P:   Reviewed health and wellness pertinent to exam  Discussed risks/benefits/warning signs of OCP. Desires continuance.  Rx Junel 24 Fe see order with instructions  Discussed regular exercise and good diet selections.  Labs: STD panel, Hep C, Affirm, GC,Chlamydia  Pap smear: yes   counseled on breast self exam, STD prevention, HIV risk factors and prevention, feminine hygiene, use and side effects of OCP's, calcium in diet and exercise, SBE.  return annually or prn  An After Visit Summary was printed and given to the patient.

## 2018-12-20 NOTE — Patient Instructions (Signed)
General topics  Next pap or exam is  due in 1 year Take a Women's multivitamin Take 1200 mg. of calcium daily - prefer dietary If any concerns in interim to call back  Breast Self-Awareness Practicing breast self-awareness may pick up problems early, prevent significant medical complications, and possibly save your life. By practicing breast self-awareness, you can become familiar with how your breasts look and feel and if your breasts are changing. This allows you to notice changes early. It can also offer you some reassurance that your breast health is good. One way to learn what is normal for your breasts and whether your breasts are changing is to do a breast self-exam. If you find a lump or something that was not present in the past, it is best to contact your caregiver right away. Other findings that should be evaluated by your caregiver include nipple discharge, especially if it is bloody; skin changes or reddening; areas where the skin seems to be pulled in (retracted); or new lumps and bumps. Breast pain is seldom associated with cancer (malignancy), but should also be evaluated by a caregiver. BREAST SELF-EXAM The best time to examine your breasts is 5 7 days after your menstrual period is over.  ExitCare Patient Information 2013 [employer].   Exercise to Stay Healthy Exercise helps you become and stay healthy. EXERCISE IDEAS AND TIPS Choose exercises that:  You enjoy.  Fit into your day. You do not need to exercise really hard to be healthy. You can do exercises at a slow or medium level and stay healthy. You can:  Stretch before and after working out.  Try yoga, Pilates, or tai chi.  Lift weights.  Walk fast, swim, jog, run, climb stairs, bicycle, dance, or rollerskate.  Take aerobic classes. Exercises that burn about 150 calories:  Running 1  miles in 15 minutes.  Playing volleyball for 45 to 60 minutes.  Washing and waxing a car for 45 to 60  minutes.  Playing touch football for 45 minutes.  Walking 1  miles in 35 minutes.  Pushing a stroller 1  miles in 30 minutes.  Playing basketball for 30 minutes.  Raking leaves for 30 minutes.  Bicycling 5 miles in 30 minutes.  Walking 2 miles in 30 minutes.  Dancing for 30 minutes.  Shoveling snow for 15 minutes.  Swimming laps for 20 minutes.  Walking up stairs for 15 minutes.  Bicycling 4 miles in 15 minutes.  Gardening for 30 to 45 minutes.  Jumping rope for 15 minutes.  Washing windows or floors for 45 to 60 minutes. Document Released: 12/05/2010 Document Revised: 01/25/2012 Document Reviewed: 12/05/2010 [employer] Patient Information 2013 [employer].   Other topics ( that may be useful information):    Sexually Transmitted Disease Sexually transmitted disease (STD) refers to any infection that is passed from person to person during sexual activity. This may happen by way of saliva, semen, blood, vaginal mucus, or urine. Common STDs include:  Gonorrhea.  Chlamydia.  Syphilis.  HIV/AIDS.  Genital herpes.  Hepatitis B and C.  Trichomonas.  Human papillomavirus (HPV).  Pubic lice. CAUSES  An STD may be spread by bacteria, virus, or parasite. A person can get an STD by:  Sexual intercourse with an infected person.  Sharing sex toys with an infected person.  Sharing needles with an infected person.  Having intimate contact with the genitals, mouth, or rectal areas of an infected person. SYMPTOMS  Some people may not have any symptoms, but   they can still pass the infection to others. Different STDs have different symptoms. Symptoms include:  Painful or bloody urination.  Pain in the pelvis, abdomen, vagina, anus, throat, or eyes.  Skin rash, itching, irritation, growths, or sores (lesions). These usually occur in the genital or anal area.  Abnormal vaginal discharge.  Penile discharge in men.  Soft, flesh-colored skin growths in the  genital or anal area.  Fever.  Pain or bleeding during sexual intercourse.  Swollen glands in the groin area.  Yellow skin and eyes (jaundice). This is seen with hepatitis. DIAGNOSIS  To make a diagnosis, your caregiver may:  Take a medical history.  Perform a physical exam.  Take a specimen (culture) to be examined.  Examine a sample of discharge under a microscope.  Perform blood test TREATMENT   Chlamydia, gonorrhea, trichomonas, and syphilis can be cured with antibiotic medicine.  Genital herpes, hepatitis, and HIV can be treated, but not cured, with prescribed medicines. The medicines will lessen the symptoms.  Genital warts from HPV can be treated with medicine or by freezing, burning (electrocautery), or surgery. Warts may come back.  HPV is a virus and cannot be cured with medicine or surgery.However, abnormal areas may be followed very closely by your caregiver and may be removed from the cervix, vagina, or vulva through office procedures or surgery. If your diagnosis is confirmed, your recent sexual partners need treatment. This is true even if they are symptom-free or have a negative culture or evaluation. They should not have sex until their caregiver says it is okay. HOME CARE INSTRUCTIONS  All sexual partners should be informed, tested, and treated for all STDs.  Take your antibiotics as directed. Finish them even if you start to feel better.  Only take over-the-counter or prescription medicines for pain, discomfort, or fever as directed by your caregiver.  Rest.  Eat a balanced diet and drink enough fluids to keep your urine clear or pale yellow.  Do not have sex until treatment is completed and you have followed up with your caregiver. STDs should be checked after treatment.  Keep all follow-up appointments, Pap tests, and blood tests as directed by your caregiver.  Only use latex condoms and water-soluble lubricants during sexual activity. Do not use  petroleum jelly or oils.  Avoid alcohol and illegal drugs.  Get vaccinated for HPV and hepatitis. If you have not received these vaccines in the past, talk to your caregiver about whether one or both might be right for you.  Avoid risky sex practices that can break the skin. The only way to avoid getting an STD is to avoid all sexual activity.Latex condoms and dental dams (for oral sex) will help lessen the risk of getting an STD, but will not completely eliminate the risk. SEEK MEDICAL CARE IF:   You have a fever.  You have any new or worsening symptoms. Document Released: 01/23/2003 Document Revised: 01/25/2012 Document Reviewed: 01/30/2011 [facility] Patient Information 2013 [employer].    Domestic Abuse You are being battered or abused if someone close to you hits, pushes, or physically hurts you in any way. You also are being abused if you are forced into activities. You are being sexually abused if you are forced to have sexual contact of any kind. You are being emotionally abused if you are made to feel worthless or if you are constantly threatened. It is important to remember that help is available. No one has the right to abuse you. PREVENTION OF FURTHER  ABUSE  Learn the warning signs of danger. This varies with situations but may include: the use of alcohol, threats, isolation from friends and family, or forced sexual contact. Leave if you feel that violence is going to occur.  If you are attacked or beaten, report it to the police so the abuse is documented. You do not have to press charges. The police can protect you while you or the attackers are leaving. Get the officer's name and badge number and a copy of the report.  Find someone you can trust and tell them what is happening to you: your caregiver, a nurse, clergy member, close friend or family member. Feeling ashamed is natural, but remember that you have done nothing wrong. No one deserves abuse. Document Released:  10/30/2000 Document Revised: 01/25/2012 Document Reviewed: 01/08/2011 [employer] Patient Information 2013 [employer].    How Much is Too Much Alcohol? Drinking too much alcohol can cause injury, accidents, and health problems. These types of problems can include:   Car crashes.  Falls.  Family fighting (domestic violence).  Drowning.  Fights.  Injuries.  Burns.  Damage to certain organs.  Having a baby with birth defects. ONE DRINK CAN BE TOO MUCH WHEN YOU ARE:  Working.  Pregnant or breastfeeding.  Taking medicines. Ask your doctor.  Driving or planning to drive. If you or someone you know has a drinking problem, get help from a doctor.  Document Released: 08/29/2009 Document Revised: 01/25/2012 Document Reviewed: 08/29/2009 Exit[employer] Patient Information 2013 [employer].   Smoking Hazards Smoking cigarettes is extremely bad for your health. Tobacco smoke has over 200 known poisons in it. There are over 60 chemicals in tobacco smoke that cause cancer. Some of the chemicals found in cigarette smoke include:   Cyanide.  Benzene.  Formaldehyde.  Methanol (wood alcohol).  Acetylene (fuel used in welding torches).  Ammonia. Cigarette smoke also contains the poisonous gases nitrogen oxide and carbon monoxide.  Cigarette smokers have an increased risk of many serious medical problems and Smoking causes approximately:  90% of all lung cancer deaths in men.  80% of all lung cancer deaths in women.  90% of deaths from chronic obstructive lung disease. Compared with nonsmokers, smoking increases the risk of:  Coronary heart disease by 2 to 4 times.  Stroke by 2 to 4 times.  Men developing lung cancer by 23 times.  Women developing lung cancer by 13 times.  Dying from chronic obstructive lung diseases by 12 times.  . Smoking is the most preventable cause of death and disease in our society.  WHY IS SMOKING ADDICTIVE?  Nicotine is the chemical  agent in tobacco that is capable of causing addiction or dependence.  When you smoke and inhale, nicotine is absorbed rapidly into the bloodstream through your lungs. Nicotine absorbed through the lungs is capable of creating a powerful addiction. Both inhaled and non-inhaled nicotine may be addictive.  Addiction studies of cigarettes and spit tobacco show that addiction to nicotine occurs mainly during the teen years, when young people begin using tobacco products. WHAT ARE THE BENEFITS OF QUITTING?  There are many health benefits to quitting smoking.   Likelihood of developing cancer and heart disease decreases. Health improvements are seen almost immediately.  Blood pressure, pulse rate, and breathing patterns start returning to normal soon after quitting. QUITTING SMOKING   American Lung Association - 1-800-LUNGUSA  American Cancer Society - 1-800-ACS-2345 Document Released: 12/10/2004 Document Revised: 01/25/2012 Document Reviewed: 08/14/2009 Exit[employer] Patient Information 2013 [employer].   Stress Management Stress is a state of physical or mental tension that often results from changes in your life or normal routine. Some common causes of stress are:  Death of a loved one.  Injuries or severe illnesses.  Getting fired or changing jobs.  Moving into a new home. Other causes may be:  Sexual problems.  Business or financial losses.  Taking on a large debt.  Regular conflict with someone at home or at work.  Constant tiredness from lack of sleep. It is not just bad things that are stressful. It may be stressful to:  Win the lottery.  Get married.  Buy a new car. The amount of stress that can be easily tolerated varies from person to person. Changes generally cause stress, regardless of the types of change. Too much stress can affect your health. It may lead to physical or emotional problems. Too little stress (boredom) may also become stressful. SUGGESTIONS TO  REDUCE STRESS:  Talk things over with your family and friends. It often is helpful to share your concerns and worries. If you feel your problem is serious, you may want to get help from a professional counselor.  Consider your problems one at a time instead of lumping them all together. Trying to take care of everything at once may seem impossible. List all the things you need to do and then start with the most important one. Set a goal to accomplish 2 or 3 things each day. If you expect to do too many in a single day you will naturally fail, causing you to feel even more stressed.  Do not use alcohol or drugs to relieve stress. Although you may feel better for a short time, they do not remove the problems that caused the stress. They can also be habit forming.  Exercise regularly - at least 3 times per week. Physical exercise can help to relieve that "uptight" feeling and will relax you.  The shortest distance between despair and hope is often a good night's sleep.  Go to bed and get up on time allowing yourself time for appointments without being rushed.  Take a short "time-out" period from any stressful situation that occurs during the day. Close your eyes and take some deep breaths. Starting with the muscles in your face, tense them, hold it for a few seconds, then relax. Repeat this with the muscles in your neck, shoulders, hand, stomach, back and legs.  Take good care of yourself. Eat a balanced diet and get plenty of rest.  Schedule time for having fun. Take a break from your daily routine to relax. HOME CARE INSTRUCTIONS   Call if you feel overwhelmed by your problems and feel you can no longer manage them on your own.  Return immediately if you feel like hurting yourself or someone else. Document Released: 04/28/2001 Document Revised: 01/25/2012 Document Reviewed: 12/19/2007 Exit[employer] Patient Information 2013 [employer].   

## 2018-12-21 LAB — HEP, RPR, HIV PANEL
HEP B S AG: NEGATIVE
HIV SCREEN 4TH GENERATION: NONREACTIVE
RPR: NONREACTIVE

## 2018-12-21 LAB — VAGINITIS/VAGINOSIS, DNA PROBE
Candida Species: NEGATIVE
Gardnerella vaginalis: NEGATIVE
Trichomonas vaginosis: NEGATIVE

## 2018-12-21 LAB — HEPATITIS C ANTIBODY: Hep C Virus Ab: <0.1 s/co ratio (ref 0.0–0.9)

## 2018-12-22 LAB — CYTOLOGY - PAP: Diagnosis: NEGATIVE

## 2018-12-23 LAB — GC/CHLAMYDIA PROBE AMP
Chlamydia trachomatis, NAA: NEGATIVE
Neisseria gonorrhoeae by PCR: NEGATIVE

## 2019-02-10 ENCOUNTER — Telehealth: Payer: Self-pay | Admitting: Certified Nurse Midwife

## 2019-02-10 NOTE — Telephone Encounter (Signed)
Spoke with patient. Reports temp of 99.4 today. Felt like she had fluid in her ear on 3/26, does not feel this today. Denies cough, SOB, fatigue, HA, body aches, runny nose, urinary symptoms or GYN complaints. Has not traveled in the last 14 days. Does not have PCP. Advised patient evaluation by PCP/Urgent Care recommended. Reviewed options for [facility] e-visits and Urgent Care. Advised covering provider will review, our office will return call if any additional recommendations.  Routing to provider for final review. Patient is agreeable to disposition. Will close encounter.

## 2019-02-10 NOTE — Telephone Encounter (Signed)
More Detail >>  Non-Urgent Medical Question  Neave Shames  Sent: Caleen Essex February 10, 2019 7:55 AM  To: [facility] Clinical Pool      Message   Good morning,    I have been running a high temperature since Wednesday morning (3/25). It has been fluctuating mostly between 99.4 F and 100.0 F. Today, so far, it has been 99.1 F. I do not have any other symptoms, like chest pains or difficulty breathing. I have had a little fluid in my left ear. I had a sinus infection a couple weeks ago. What do you recommend for me to do for this?    Thank you!

## 2019-08-29 NOTE — Telephone Encounter (Signed)
Message faxed from pharmacy states:  "Insurance prefers [place] Fe 1/20 or [place] 1/20, not the 24 for lower copay. Please resend new rx if appropriate. Thanks!".   Please advise on patient's birth control.

## 2019-08-30 ENCOUNTER — Other Ambulatory Visit: Payer: Self-pay | Admitting: Certified Nurse Midwife

## 2019-10-27 DIAGNOSIS — R519 Headache, unspecified: Secondary | ICD-10-CM | POA: Insufficient documentation

## 2019-12-15 ENCOUNTER — Ambulatory Visit
Admission: EM | Admit: 2019-12-15 | Discharge: 2019-12-15 | Disposition: A | Discharge status: Home / Self Care | Payer: 59 | Attending: Family Medicine | Admitting: Family Medicine

## 2019-12-15 ENCOUNTER — Other Ambulatory Visit: Payer: Self-pay

## 2019-12-15 DIAGNOSIS — G4489 Other headache syndrome: Secondary | ICD-10-CM

## 2019-12-15 DIAGNOSIS — G44219 Episodic tension-type headache, not intractable: Secondary | ICD-10-CM

## 2019-12-15 MED ORDER — CEFDINIR 300 MG PO CAPS: 300.0000 mg | ORAL_CAPSULE | Freq: Two times a day (BID) | ORAL | 0 refills | Status: AC

## 2019-12-15 NOTE — ED Provider Notes (Signed)
Renaldo Fiddler    CSN: [id number] Arrival date & time: 12/15/19  1622      History   Chief Complaint Chief Complaint  Patient presents with  . Headache    HPI Felicia Lambert is a 29 y.o. female.   Reports that she has been having intermittent headaches that are lasting from 10 seconds to 30 mins or more. Reports that she has tried Motrin with little relief. Reports that she does not like to take meds. Denies fever, chills, body aches, changes in vision. Reports that she was treated 6 weeks ago with antibiotics for ear infection.   The history is provided by the patient.    History reviewed. No pertinent past medical history.  Patient Active Problem List   Diagnosis Date Noted  . Acute bacterial sinusitis 10/17/2014  . Allergy to other foods, tree nut allergy 10/23/2013    Past Surgical History:  Procedure Laterality Date  . TONSILLECTOMY    . WISDOM TOOTH EXTRACTION      OB History    Gravida  1   Para  0   Term  0   Preterm  0   AB  1   Living  0     SAB  0   TAB  1   Ectopic  0   Multiple  0   Live Births               Home Medications    Prior to Admission medications   Medication Sig Start Date End Date Taking? Authorizing Provider  Norethindrone Acetate-Ethinyl Estrad-FE (JUNEL FE 24) 1-20 MG-MCG(24) tablet Take 1 tablet by mouth daily. 12/20/18  Yes Verner Chol, CNM  cefdinir (OMNICEF) 300 MG capsule Take 1 capsule (300 mg total) by mouth 2 (two) times daily for 10 days. 12/15/19 12/25/19  Moshe Cipro, NP    Family History Family History  Problem Relation Age of Onset  . Stroke Father     Social History Social History   Tobacco Use  . Smoking status: Never Smoker  . Smokeless tobacco: Never Used  Substance Use Topics  . Alcohol use: Yes    Alcohol/week: 5.0 standard drinks    Types: 5 Standard drinks or equivalent per week  . Drug use: No     Allergies   Peanut-containing drug products   Review of  Systems Review of Systems  Constitutional: Negative for appetite change, chills, fatigue and fever.  HENT: Negative for congestion, ear pain, rhinorrhea, sinus pressure, sinus pain and sore throat.   Eyes: Negative for pain and visual disturbance.  Respiratory: Negative for cough and shortness of breath.   Cardiovascular: Negative for chest pain and palpitations.  Gastrointestinal: Negative for abdominal pain, diarrhea, nausea and vomiting.  Genitourinary: Negative for dysuria and hematuria.  Musculoskeletal: Negative for arthralgias, back pain, neck pain and neck stiffness.  Skin: Negative for color change and rash.  Neurological: Positive for headaches. Negative for dizziness, seizures, syncope, speech difficulty, light-headedness and numbness.  All other systems reviewed and are negative.    Physical Exam Triage Vital Signs ED Triage Vitals  Enc Vitals Group     BP      Pulse      Resp      Temp      Temp src      SpO2      Weight      Height      Head Circumference      Peak Flow  Pain Score      Pain Loc      Pain Edu?      Excl. in GC?    No data found.  Updated Vital Signs BP 133/87 (BP Location: Left Arm)   Pulse 97   Temp 98.4 F (36.9 C) (Oral)   Resp 15   Ht 5\' 2"  (1.575 m)   Wt 160 lb (72.6 kg)   LMP 12/05/2019   SpO2 97%   BMI 29.26 kg/m   Visual Acuity Right Eye Distance: 20/25 Left Eye Distance: 20/25 Bilateral Distance: 20/25  Right Eye Near:   Left Eye Near:    Bilateral Near:     Physical Exam Vitals and nursing note reviewed.  Constitutional:      General: She is not in acute distress.    Appearance: She is well-developed.  HENT:     Head: Normocephalic and atraumatic.     Right Ear: Tympanic membrane normal.     Left Ear: Tympanic membrane is erythematous and bulging.     Nose: Nose normal. No congestion or rhinorrhea.     Mouth/Throat:     Mouth: Mucous membranes are moist.     Pharynx: No posterior oropharyngeal  erythema.  Eyes:     Conjunctiva/sclera: Conjunctivae normal.  Cardiovascular:     Rate and Rhythm: Normal rate and regular rhythm.     Pulses: Normal pulses.     Heart sounds: Normal heart sounds. No murmur.  Pulmonary:     Effort: Pulmonary effort is normal. No respiratory distress.     Breath sounds: Normal breath sounds. No stridor. No wheezing, rhonchi or rales.  Abdominal:     General: Abdomen is flat.     Palpations: Abdomen is soft.     Tenderness: There is no abdominal tenderness.  Musculoskeletal:        General: No tenderness, deformity or signs of injury. Normal range of motion.     Cervical back: Normal range of motion and neck supple. No rigidity or tenderness.  Skin:    General: Skin is warm and dry.  Neurological:     General: No focal deficit present.     Mental Status: She is alert and oriented to person, place, and time.  Psychiatric:        Mood and Affect: Mood normal.        Behavior: Behavior normal.      UC Treatments / Results  Labs (all labs ordered are listed, but only abnormal results are displayed) Labs Reviewed - No data to display  EKG   Radiology No results found.  Procedures Procedures (including critical care time)  Medications Ordered in UC Medications - No data to display  Initial Impression / Assessment and Plan / UC Course  I have reviewed the triage vital signs and the nursing notes.  Pertinent labs & imaging results that were available during my care of the patient were reviewed by me and considered in my medical decision making (see chart for details).     Presents with episodic headaches today. Little relief with Motrin, has pain in different areas of her head, mostly base of neck and temples. Instructed to keep a headache journal. Instructed to visit ophthalmology and follow up with primary care provider. Also has Left AOM, prescribed omnicef. Instructed to keep a headache journal and when to go to the Emergency  Department.  Final Clinical Impressions(s) / UC Diagnoses   Final diagnoses:  Other headache syndrome  Discharge Instructions     You have headaches that seem to come and go with sharp pains. You may take ibuprofen, tylenol, or excedrin migraine for these headaches.   Follow up with your eye doctor or primary care provider.   You also have an ear infection in your Left ear. Take the omnicef as directed on your prescription bottle. If you develop a fever, chills, body aches, or other symptoms, contact your primary care provider.   Report to the Emergency Room for high fever, thunder-clap headache, or if your headaches are not responding to medications, or other serious concerns.     ED Prescriptions    Medication Sig Dispense Auth. Provider   cefdinir (OMNICEF) 300 MG capsule Take 1 capsule (300 mg total) by mouth 2 (two) times daily for 10 days. 20 capsule Faustino Congress, NP     PDMP not reviewed this encounter.   Faustino Congress, NP 12/15/19 1706

## 2019-12-15 NOTE — Discharge Instructions (Addendum)
You have headaches that seem to come and go with sharp pains. You may take ibuprofen, tylenol, or excedrin migraine for these headaches.   Follow up with your eye doctor or primary care provider.   You also have an ear infection in your Left ear. Take the omnicef as directed on your prescription bottle. If you develop a fever, chills, body aches, or other symptoms, contact your primary care provider.   Report to the Emergency Room for high fever, thunder-clap headache, or if your headaches are not responding to medications, or other serious concerns.

## 2019-12-15 NOTE — ED Triage Notes (Signed)
Patient complains of headaches that have become more frequent. Patient describes her headaches as a sharp pain that moves around her head. Patient states that she does notice light sensitivity. Patient states that this all started having the headaches 6 weeks ago but started this time on Tuesday.

## 2019-12-22 ENCOUNTER — Ambulatory Visit: Payer: BLUE CROSS/BLUE SHIELD | Admitting: Certified Nurse Midwife

## 2019-12-27 ENCOUNTER — Ambulatory Visit: Payer: 59 | Admitting: Certified Nurse Midwife

## 2020-01-10 ENCOUNTER — Ambulatory Visit: Payer: 59 | Admitting: Certified Nurse Midwife

## 2020-01-10 ENCOUNTER — Other Ambulatory Visit: Payer: Self-pay

## 2020-01-10 ENCOUNTER — Encounter: Payer: Self-pay | Admitting: Certified Nurse Midwife

## 2020-01-10 VITALS — BP 104/70 | HR 64 | Temp 97.9°F | Resp 16 | Ht 62.25 in | Wt 163.0 lb

## 2020-01-10 DIAGNOSIS — Z0000 Encounter for general adult medical examination without abnormal findings: Secondary | ICD-10-CM | POA: Diagnosis not present

## 2020-01-10 DIAGNOSIS — N926 Irregular menstruation, unspecified: Secondary | ICD-10-CM

## 2020-01-10 DIAGNOSIS — Z113 Encounter for screening for infections with a predominantly sexual mode of transmission: Secondary | ICD-10-CM

## 2020-01-10 DIAGNOSIS — Z01419 Encounter for gynecological examination (general) (routine) without abnormal findings: Secondary | ICD-10-CM

## 2020-01-10 DIAGNOSIS — Z30011 Encounter for initial prescription of contraceptive pills: Secondary | ICD-10-CM

## 2020-01-10 LAB — POCT URINALYSIS DIPSTICK
Bilirubin, UA: NEGATIVE
Blood, UA: NEGATIVE
Glucose, UA: NEGATIVE
Ketones, UA: NEGATIVE
Leukocytes, UA: NEGATIVE
Nitrite, UA: NEGATIVE
Protein, UA: NEGATIVE
Urobilinogen, UA: NEGATIVE E.U./dL — AB
pH, UA: 5 (ref 5.0–8.0)

## 2020-01-10 MED ORDER — NORETHIN ACE-ETH ESTRAD-FE 1-20 MG-MCG(24) PO TABS: 1.0000 | ORAL_TABLET | Freq: Every day | ORAL | 3 refills | Status: DC

## 2020-01-10 NOTE — Progress Notes (Signed)
29 y.o. G1P0010 Single  Caucasian Fe here for annual exam. Periods normal, no issues.  Contraception OCP working well, denies warning signs with use.Patient has  recently seen allergist and will be doing allergy medication soon. Patient has been seen by urgent care and also treated for ear infection. Patient has had some dizziness and followed up with PCP, no issues now. Partner change would like to have STD screening. Has noted some swelling in neck, please check. No other health issues today.  Patient's last menstrual period was 01/05/2020 (exact date).          Sexually active: Yes.    The current method of family planning is condoms all the time.    Exercising: Yes.    bike & yoga Smoker:  no  Review of Systems  Constitutional: Negative.        Neck swollen, not feeling well, headaches  HENT: Negative.   Eyes: Negative.   Respiratory: Negative.   Cardiovascular: Negative.   Gastrointestinal: Negative.   Genitourinary: Negative.   Musculoskeletal: Negative.   Skin: Negative.   Neurological: Negative.   Endo/Heme/Allergies: Negative.   Psychiatric/Behavioral: Negative.     Health Maintenance: Pap:  12-09-2017 neg, 12-20-2018 neg History of Abnormal Pap: yes MMG:  none Self Breast exams: yes Colonoscopy:  none BMD:   none TDaP:  2013 Shingles: no Pneumonia: no Hep C and HIV: both neg 2020 Labs: poct urine-neg   reports that she has never smoked. She has never used smokeless tobacco. She reports current alcohol use of about 5.0 standard drinks of alcohol per week. She reports that she does not use drugs.  History reviewed. No pertinent past medical history.  Past Surgical History:  Procedure Laterality Date  . TONSILLECTOMY    . WISDOM TOOTH EXTRACTION      No current outpatient medications on file.   No current facility-administered medications for this visit.    Family History  Problem Relation Age of Onset  . Stroke Father     ROS:  Pertinent items are noted  in HPI.  Otherwise, a comprehensive ROS was negative.  Exam:   BP 104/70   Pulse 64   Temp 97.9 F (36.6 C) (Skin)   Resp 16   Ht 5' 2.25" (1.581 m)   Wt 163 lb (73.9 kg)   LMP 01/05/2020 (Exact Date)   BMI 29.57 kg/m  Height: 5' 2.25" (158.1 cm) Ht Readings from Last 3 Encounters:  01/10/20 5' 2.25" (1.581 m)  12/15/19 5\' 2"  (1.575 m)  12/20/18 5' 2.5" (1.588 m)    General appearance: alert, cooperative and appears stated age Head: Normocephalic, without obvious abnormality, atraumatic Neck: no adenopathy, supple, symmetrical, trachea midline and thyroid normal to inspection and palpation Lungs: clear to auscultation bilaterally Breasts: normal appearance, no masses or tenderness, No nipple retraction or dimpling, No nipple discharge or bleeding, No axillary or supraclavicular adenopathy Heart: regular rate and rhythm Abdomen: soft, non-tender; no masses,  no organomegaly Extremities: extremities normal, atraumatic, no cyanosis or edema Skin: Skin color, texture, turgor normal. No rashes or lesions Lymph nodes: Cervical, supraclavicular, and axillary nodes normal. No abnormal inguinal nodes palpated Neurologic: Grossly normal   Pelvic: External genitalia:  no lesions              Urethra:  normal appearing urethra with no masses, tenderness or lesions              Bartholin's and Skene's: normal  Vagina: normal appearing vagina with normal color and discharge, no lesions              Cervix: no cervical motion tenderness, no lesions and nulliparous appearance              Pap taken: No. Bimanual Exam:  Uterus:  normal size, contour, position, consistency, mobility, non-tender and anteverted              Adnexa: normal adnexa and no mass, fullness, tenderness               Rectovaginal: Confirms               Anus:  normal sphincter tone, no lesions  Chaperone present: yes  A:  Well Woman with normal exam  Contraception OCP desires change of brand due to  insurance coverage.  STD screening  Ear infection and on allergy medication  Screening labs    P:   Reviewed health and wellness pertinent to exam  Risks/benefits/warning signs with OCP use and have changed her OCP to see if insurance would cover. Any concerns or side effects need to advise.  Rx Loestrin 24 FE see order with instructions  Lab: HIV,RPR, Hep C, Affirm, GC,Chlamydia  Lab: TSH  Discussed follow up with PCP as indicated  Discussed ear infection and role of vaccines, Questions addressed.  Pap smear: yes   counseled on breast self exam, STD prevention, HIV risk factors and prevention, feminine hygiene, adequate intake of calcium and vitamin D, diet and exercise  return annually or prn  An After Visit Summary was printed and given to the patient.

## 2020-01-10 NOTE — Patient Instructions (Signed)
General topics  Next pap or exam is  due in 1 year Take a Women's multivitamin Take 1200 mg. of calcium daily - prefer dietary If any concerns in interim to call back  Breast Self-Awareness Practicing breast self-awareness may pick up problems early, prevent significant medical complications, and possibly save your life. By practicing breast self-awareness, you can become familiar with how your breasts look and feel and if your breasts are changing. This allows you to notice changes early. It can also offer you some reassurance that your breast health is good. One way to learn what is normal for your breasts and whether your breasts are changing is to do a breast self-exam. If you find a lump or something that was not present in the past, it is best to contact your caregiver right away. Other findings that should be evaluated by your caregiver include nipple discharge, especially if it is bloody; skin changes or reddening; areas where the skin seems to be pulled in (retracted); or new lumps and bumps. Breast pain is seldom associated with cancer (malignancy), but should also be evaluated by a caregiver. BREAST SELF-EXAM The best time to examine your breasts is 5 7 days after your menstrual period is over.  ExitCare Patient Information 2013 [employer].   Exercise to Stay Healthy Exercise helps you become and stay healthy. EXERCISE IDEAS AND TIPS Choose exercises that:  You enjoy.  Fit into your day. You do not need to exercise really hard to be healthy. You can do exercises at a slow or medium level and stay healthy. You can:  Stretch before and after working out.  Try yoga, Pilates, or tai chi.  Lift weights.  Walk fast, swim, jog, run, climb stairs, bicycle, dance, or rollerskate.  Take aerobic classes. Exercises that burn about 150 calories:  Running 1  miles in 15 minutes.  Playing volleyball for 45 to 60 minutes.  Washing and waxing a car for 45 to 60  minutes.  Playing touch football for 45 minutes.  Walking 1  miles in 35 minutes.  Pushing a stroller 1  miles in 30 minutes.  Playing basketball for 30 minutes.  Raking leaves for 30 minutes.  Bicycling 5 miles in 30 minutes.  Walking 2 miles in 30 minutes.  Dancing for 30 minutes.  Shoveling snow for 15 minutes.  Swimming laps for 20 minutes.  Walking up stairs for 15 minutes.  Bicycling 4 miles in 15 minutes.  Gardening for 30 to 45 minutes.  Jumping rope for 15 minutes.  Washing windows or floors for 45 to 60 minutes. Document Released: 12/05/2010 Document Revised: 01/25/2012 Document Reviewed: 12/05/2010 Exit[employer] Patient Information 2013 [employer].   Other topics ( that may be useful information):    Sexually Transmitted Disease Sexually transmitted disease (STD) refers to any infection that is passed from person to person during sexual activity. This may happen by way of saliva, semen, blood, vaginal mucus, or urine. Common STDs include:  Gonorrhea.  Chlamydia.  Syphilis.  HIV/AIDS.  Genital herpes.  Hepatitis B and C.  Trichomonas.  Human papillomavirus (HPV).  Pubic lice. CAUSES  An STD may be spread by bacteria, virus, or parasite. A person can get an STD by:  Sexual intercourse with an infected person.  Sharing sex toys with an infected person.  Sharing needles with an infected person.  Having intimate contact with the genitals, mouth, or rectal areas of an infected person. SYMPTOMS  Some people may not have any symptoms, but   they can still pass the infection to others. Different STDs have different symptoms. Symptoms include:  Painful or bloody urination.  Pain in the pelvis, abdomen, vagina, anus, throat, or eyes.  Skin rash, itching, irritation, growths, or sores (lesions). These usually occur in the genital or anal area.  Abnormal vaginal discharge.  Penile discharge in men.  Soft, flesh-colored skin growths in the  genital or anal area.  Fever.  Pain or bleeding during sexual intercourse.  Swollen glands in the groin area.  Yellow skin and eyes (jaundice). This is seen with hepatitis. DIAGNOSIS  To make a diagnosis, your caregiver may:  Take a medical history.  Perform a physical exam.  Take a specimen (culture) to be examined.  Examine a sample of discharge under a microscope.  Perform blood test TREATMENT   Chlamydia, gonorrhea, trichomonas, and syphilis can be cured with antibiotic medicine.  Genital herpes, hepatitis, and HIV can be treated, but not cured, with prescribed medicines. The medicines will lessen the symptoms.  Genital warts from HPV can be treated with medicine or by freezing, burning (electrocautery), or surgery. Warts may come back.  HPV is a virus and cannot be cured with medicine or surgery.However, abnormal areas may be followed very closely by your caregiver and may be removed from the cervix, vagina, or vulva through office procedures or surgery. If your diagnosis is confirmed, your recent sexual partners need treatment. This is true even if they are symptom-free or have a negative culture or evaluation. They should not have sex until their caregiver says it is okay. HOME CARE INSTRUCTIONS  All sexual partners should be informed, tested, and treated for all STDs.  Take your antibiotics as directed. Finish them even if you start to feel better.  Only take over-the-counter or prescription medicines for pain, discomfort, or fever as directed by your caregiver.  Rest.  Eat a balanced diet and drink enough fluids to keep your urine clear or pale yellow.  Do not have sex until treatment is completed and you have followed up with your caregiver. STDs should be checked after treatment.  Keep all follow-up appointments, Pap tests, and blood tests as directed by your caregiver.  Only use latex condoms and water-soluble lubricants during sexual activity. Do not use  petroleum jelly or oils.  Avoid alcohol and illegal drugs.  Get vaccinated for HPV and hepatitis. If you have not received these vaccines in the past, talk to your caregiver about whether one or both might be right for you.  Avoid risky sex practices that can break the skin. The only way to avoid getting an STD is to avoid all sexual activity.Latex condoms and dental dams (for oral sex) will help lessen the risk of getting an STD, but will not completely eliminate the risk. SEEK MEDICAL CARE IF:   You have a fever.  You have any new or worsening symptoms. Document Released: 01/23/2003 Document Revised: 01/25/2012 Document Reviewed: 01/30/2011 [facility] Patient Information 2013 [employer].    Domestic Abuse You are being battered or abused if someone close to you hits, pushes, or physically hurts you in any way. You also are being abused if you are forced into activities. You are being sexually abused if you are forced to have sexual contact of any kind. You are being emotionally abused if you are made to feel worthless or if you are constantly threatened. It is important to remember that help is available. No one has the right to abuse you. PREVENTION OF FURTHER  ABUSE  Learn the warning signs of danger. This varies with situations but may include: the use of alcohol, threats, isolation from friends and family, or forced sexual contact. Leave if you feel that violence is going to occur.  If you are attacked or beaten, report it to the police so the abuse is documented. You do not have to press charges. The police can protect you while you or the attackers are leaving. Get the officer's name and badge number and a copy of the report.  Find someone you can trust and tell them what is happening to you: your caregiver, a nurse, clergy member, close friend or family member. Feeling ashamed is natural, but remember that you have done nothing wrong. No one deserves abuse. Document Released:  10/30/2000 Document Revised: 01/25/2012 Document Reviewed: 01/08/2011 Exit[employer] Patient Information 2013 [employer].    How Much is Too Much Alcohol? Drinking too much alcohol can cause injury, accidents, and health problems. These types of problems can include:   Car crashes.  Falls.  Family fighting (domestic violence).  Drowning.  Fights.  Injuries.  Burns.  Damage to certain organs.  Having a baby with birth defects. ONE DRINK CAN BE TOO MUCH WHEN YOU ARE:  Working.  Pregnant or breastfeeding.  Taking medicines. Ask your doctor.  Driving or planning to drive. If you or someone you know has a drinking problem, get help from a doctor.  Document Released: 08/29/2009 Document Revised: 01/25/2012 Document Reviewed: 08/29/2009 ExitCare Patient Information 2013 [employer].   Smoking Hazards Smoking cigarettes is extremely bad for your health. Tobacco smoke has over 200 known poisons in it. There are over 60 chemicals in tobacco smoke that cause cancer. Some of the chemicals found in cigarette smoke include:   Cyanide.  Benzene.  Formaldehyde.  Methanol (wood alcohol).  Acetylene (fuel used in welding torches).  Ammonia. Cigarette smoke also contains the poisonous gases nitrogen oxide and carbon monoxide.  Cigarette smokers have an increased risk of many serious medical problems and Smoking causes approximately:  90% of all lung cancer deaths in men.  80% of all lung cancer deaths in women.  90% of deaths from chronic obstructive lung disease. Compared with nonsmokers, smoking increases the risk of:  Coronary heart disease by 2 to 4 times.  Stroke by 2 to 4 times.  Men developing lung cancer by 23 times.  Women developing lung cancer by 13 times.  Dying from chronic obstructive lung diseases by 12 times.  . Smoking is the most preventable cause of death and disease in our society.  WHY IS SMOKING ADDICTIVE?  Nicotine is the chemical  agent in tobacco that is capable of causing addiction or dependence.  When you smoke and inhale, nicotine is absorbed rapidly into the bloodstream through your lungs. Nicotine absorbed through the lungs is capable of creating a powerful addiction. Both inhaled and non-inhaled nicotine may be addictive.  Addiction studies of cigarettes and spit tobacco show that addiction to nicotine occurs mainly during the teen years, when young people begin using tobacco products. WHAT ARE THE BENEFITS OF QUITTING?  There are many health benefits to quitting smoking.   Likelihood of developing cancer and heart disease decreases. Health improvements are seen almost immediately.  Blood pressure, pulse rate, and breathing patterns start returning to normal soon after quitting. QUITTING SMOKING   American Lung Association - 1-800-LUNGUSA  American Cancer Society - 1-800-ACS-2345 Document Released: 12/10/2004 Document Revised: 01/25/2012 Document Reviewed: 08/14/2009 ExitCare Patient Information 2013 [employer].   Stress Management Stress is a state of physical or mental tension that often results from changes in your life or normal routine. Some common causes of stress are:  Death of a loved one.  Injuries or severe illnesses.  Getting fired or changing jobs.  Moving into a new home. Other causes may be:  Sexual problems.  Business or financial losses.  Taking on a large debt.  Regular conflict with someone at home or at work.  Constant tiredness from lack of sleep. It is not just bad things that are stressful. It may be stressful to:  Win the lottery.  Get married.  Buy a new car. The amount of stress that can be easily tolerated varies from person to person. Changes generally cause stress, regardless of the types of change. Too much stress can affect your health. It may lead to physical or emotional problems. Too little stress (boredom) may also become stressful. SUGGESTIONS TO  REDUCE STRESS:  Talk things over with your family and friends. It often is helpful to share your concerns and worries. If you feel your problem is serious, you may want to get help from a professional counselor.  Consider your problems one at a time instead of lumping them all together. Trying to take care of everything at once may seem impossible. List all the things you need to do and then start with the most important one. Set a goal to accomplish 2 or 3 things each day. If you expect to do too many in a single day you will naturally fail, causing you to feel even more stressed.  Do not use alcohol or drugs to relieve stress. Although you may feel better for a short time, they do not remove the problems that caused the stress. They can also be habit forming.  Exercise regularly - at least 3 times per week. Physical exercise can help to relieve that "uptight" feeling and will relax you.  The shortest distance between despair and hope is often a good night's sleep.  Go to bed and get up on time allowing yourself time for appointments without being rushed.  Take a short "time-out" period from any stressful situation that occurs during the day. Close your eyes and take some deep breaths. Starting with the muscles in your face, tense them, hold it for a few seconds, then relax. Repeat this with the muscles in your neck, shoulders, hand, stomach, back and legs.  Take good care of yourself. Eat a balanced diet and get plenty of rest.  Schedule time for having fun. Take a break from your daily routine to relax. HOME CARE INSTRUCTIONS   Call if you feel overwhelmed by your problems and feel you can no longer manage them on your own.  Return immediately if you feel like hurting yourself or someone else. Document Released: 04/28/2001 Document Revised: 01/25/2012 Document Reviewed: 12/19/2007 ExitCare Patient Information 2013 [employer].   Oral Contraception Use Oral contraceptive pills  (OCPs) are medicines that you take to prevent pregnancy. OCPs work by:  Preventing the ovaries from releasing eggs.  Thickening mucus in the lower part of the uterus (cervix), which prevents sperm from entering the uterus.  Thinning the lining of the uterus (endometrium), which prevents a fertilized egg from attaching to the endometrium. OCPs are highly effective when taken exactly as prescribed. However, OCPs do not prevent sexually transmitted infections (STIs). Safe sex practices, such as using condoms while on an OCP, can help prevent STIs. Before taking   OCPs, you may have a physical exam, blood test, and Pap test. A Pap test involves taking a sample of cells from your cervix to check for cancer. Discuss with your health care provider the possible side effects of the OCP you may be prescribed. When you start an OCP, be aware that it can take 2-3 months for your body to adjust to changes in hormone levels. How to take oral contraceptive pills Follow instructions from your health care provider about how to start taking your first cycle of OCPs. Your health care provider may recommend that you:  Start the pill on day 1 of your menstrual period. If you start at this time, you will not need any backup form of birth control (contraception), such as condoms.  Start the pill on the first Sunday after your menstrual period or on the day you get your prescription. In these cases, you will need to use backup contraception for the first week.  Start the pill at any time of your cycle. ? If you take the pill within 5 days of the start of your period, you will not need a backup form of contraception. ? If you start at any other time of your menstrual cycle, you will need to use another form of contraception for 7 days. If your OCP is the type called a minipill, it will protect you from pregnancy after taking it for 2 days (48 hours), and you can stop using backup contraception after that time. After you  have started taking OCPs:  If you forget to take 1 pill, take it as soon as you remember. Take the next pill at the regular time.  If you miss 2 or more pills, call your health care provider. Different pills have different instructions for missed doses. Use backup birth control until your next menstrual period starts.  If you use a 28-day pack that contains inactive pills and you miss 1 of the last 7 pills (pills with no hormones), throw away the rest of the non-hormone pills and start a new pill pack. No matter which day you start the OCP, you will always start a new pack on that same day of the week. Have an extra pack of OCPs and a backup contraceptive method available in case you miss some pills or lose your OCP pack. Follow these instructions at home:  Do not use any products that contain nicotine or tobacco, such as cigarettes and e-cigarettes. If you need help quitting, ask your health care provider.  Always use a condom to protect against STIs. OCPs do not protect against STIs.  Use a calendar to mark the days of your menstrual period.  Read the information and directions that came with your OCP. Talk to your health care provider if you have questions. Contact a health care provider if:  You develop nausea and vomiting.  You have abnormal vaginal discharge or bleeding.  You develop a rash.  You miss your menstrual period. Depending on the type of OCP you are taking, this may be a sign of pregnancy. Ask your health care provider for more information.  You are losing your hair.  You need treatment for mood swings or depression.  You get dizzy when taking the OCP.  You develop acne after taking the OCP.  You become pregnant or think you may be pregnant.  You have diarrhea, constipation, and abdominal pain or cramps.  You miss 2 or more pills. Get help right away if:  You develop chest   pain.  You develop shortness of breath.  You have an uncontrolled or severe  headache.  You develop numbness or slurred speech.  You develop visual or speech problems.  You develop pain, redness, and swelling in your legs.  You develop weakness or numbness in your arms or legs. Summary  Oral contraceptive pills (OCPs) are medicines that you take to prevent pregnancy.  OCPs do not prevent sexually transmitted infections (STIs). Always use a condom to protect against STIs.  When you start an OCP, be aware that it can take 2-3 months for your body to adjust to changes in hormone levels.  Read all the information and directions that come with your OCP. This information is not intended to replace advice given to you by your health care provider. Make sure you discuss any questions you have with your health care provider. Document Revised: 02/24/2019 Document Reviewed: 12/14/2016 Elsevier Patient Education  [place].

## 2020-01-11 ENCOUNTER — Other Ambulatory Visit: Payer: Self-pay | Admitting: Certified Nurse Midwife

## 2020-01-11 DIAGNOSIS — R899 Unspecified abnormal finding in specimens from other organs, systems and tissues: Secondary | ICD-10-CM

## 2020-01-11 LAB — HIV ANTIBODY (ROUTINE TESTING W REFLEX): HIV Screen 4th Generation wRfx: NONREACTIVE

## 2020-01-11 LAB — RPR: RPR Ser Ql: NONREACTIVE

## 2020-01-11 LAB — TSH: TSH: 0.446 u[IU]/mL — ABNORMAL LOW (ref 0.450–4.500)

## 2020-01-11 LAB — HEPATITIS C ANTIBODY: Hep C Virus Ab: <0.1 s/co ratio (ref 0.0–0.9)

## 2020-01-11 LAB — GC/CHLAMYDIA PROBE AMP
Chlamydia trachomatis, NAA: NEGATIVE
Neisseria Gonorrhoeae by PCR: NEGATIVE

## 2020-01-11 LAB — VAGINITIS/VAGINOSIS, DNA PROBE
Candida Species: NEGATIVE
Gardnerella vaginalis: NEGATIVE
Trichomonas vaginosis: NEGATIVE

## 2020-01-12 ENCOUNTER — Ambulatory Visit: Payer: 59 | Admitting: Family Medicine

## 2020-01-12 ENCOUNTER — Telehealth: Payer: Self-pay

## 2020-01-12 ENCOUNTER — Encounter: Payer: Self-pay | Admitting: Family Medicine

## 2020-01-12 ENCOUNTER — Other Ambulatory Visit: Payer: Self-pay

## 2020-01-12 VITALS — BP 127/67 | HR 84 | Temp 97.8°F | Resp 16 | Ht 62.0 in | Wt 163.4 lb

## 2020-01-12 DIAGNOSIS — G44229 Chronic tension-type headache, not intractable: Secondary | ICD-10-CM | POA: Diagnosis not present

## 2020-01-12 DIAGNOSIS — Z7689 Persons encountering health services in other specified circumstances: Secondary | ICD-10-CM

## 2020-01-12 DIAGNOSIS — F419 Anxiety disorder, unspecified: Secondary | ICD-10-CM

## 2020-01-12 DIAGNOSIS — H6983 Other specified disorders of Eustachian tube, bilateral: Secondary | ICD-10-CM | POA: Diagnosis not present

## 2020-01-12 MED ORDER — FLUTICASONE PROPIONATE 50 MCG/ACT NA SUSP: 2.0000 | Freq: Every day | NASAL | 3 refills | Status: DC

## 2020-01-12 NOTE — Telephone Encounter (Signed)
Left message for call back.

## 2020-01-12 NOTE — Telephone Encounter (Signed)
-----   Message from Verner Chol, CNM sent at 01/11/2020 10:32 AM EST ----- Notify patient that her HIV,Syphilis and Hepatitis C screening was negative. Others pending Her TSH (thyroid ) level is borderline low,  will need to recheck this in 6 weeks.  Order placed please schedule

## 2020-01-12 NOTE — Progress Notes (Signed)
Subjective:    Patient ID: Felicia Lambert, female    DOB: 02/19/1991, 29 y.o.   MRN: [id number]  Felicia Lambert is a 29 y.o. female presenting on 01/12/2020 for Establish Care (sharp and quick HA, Dizziness onset 2 month had 2 ear infections)   HPI  Episodic Headaches / Eustachian Tube Dysfunction / Allergies Rhinitis Reports symptoms onset about 2 months ago, first onset suddenly while shopping, she felt almost like a panic attack, very dizzy, heart beating fast, sweaty and clammy. Next day she was getting ready for work, still felt bad and ultimately went to [facility], they looked into ears and did see fluid but no infection, they did some blood work and were concerned for "infection" uncertain type, did not have symptoms of sinusitis or bronchitis - Now worsening headaches over past 1 month, seems to be both sides behind temple, or behind ears, and top of head, can be brief short and sharp lasting 1 min or so, often otherwise can feel "full" with headache. History of some episodic headaches in past, mostly left sided - Tried Ibuprofen 200mg  x 4 = 800mg  x 1 daily - limited relief lately - Additionally has pressure behind Left eye. She went to eye doctor and had evaluation that was essentially normal. - She feels increased thirst since taking Cefdinir, and some swelling inside mouth lip and tip of tongue - Admits caffeine, 6 oz cold brew daily, weekend bigger cup - For sinuses, using nasal saline spray, has air purifier. Thought allergy symptoms or sinuses may be triggered by carpet at work, new job in August - Left Ear with "crackling noises", last ear infection 12/15/19 - She will return to Allergist   Episodic Chest Wall Pains / Anxiety Chronic problem but never really diagnosed before. Says she has history of feeling anxious and is often worrying. No treatment prior. Unsure of some symptoms headache episodic chest discomfort linked to this. - Episodic chest pains left sided mid chest  wall, seems to last 10-15 min without any known trigger, non exertional  Low TSH Recent GYN visit, felt some swollen lymph nodes in neck - Fam history thyroid issue w/ father. She had lab results that showed low  - Additionally with neck swollen and sore but no obvious lymph nodes. She saw her GYN recently and they did some labs and evaluation, had TSH 0.446 that was mildly low. However she also had labs from urgent care showed TSH 0.95 back in early 11/2019   Depression screen PHQ 2/9 01/12/2020  Decreased Interest 0  Down, Depressed, Hopeless 0  PHQ - 2 Score 0   No flowsheet data found.    History reviewed. No pertinent past medical history. Past Surgical History:  Procedure Laterality Date  . TONSILLECTOMY  2014  . WISDOM TOOTH EXTRACTION  2011   Social History   Socioeconomic History  . Marital status: Single    Spouse name: Not on file  . Number of children: Not on file  . Years of education: College  . Highest education level: Bachelor's degree (e.g., BA, AB, BS)  Occupational History  . Not on file  Tobacco Use  . Smoking status: Never Smoker  . Smokeless tobacco: Never Used  Substance and Sexual Activity  . Alcohol use: Yes    Alcohol/week: 5.0 standard drinks    Types: 5 Standard drinks or equivalent per week  . Drug use: No  . Sexual activity: Yes    Partners: Male    Birth control/protection:  Condom  Other Topics Concern  . Not on file  Social History Narrative  . Not on file   Social Determinants of Health   Financial Resource Strain:   . Difficulty of Paying Living Expenses: Not on file  Food Insecurity:   . Worried About Programme researcher, broadcasting/film/video in the Last Year: Not on file  . Ran Out of Food in the Last Year: Not on file  Transportation Needs:   . Lack of Transportation (Medical): Not on file  . Lack of Transportation (Non-Medical): Not on file  Physical Activity:   . Days of Exercise per Week: Not on file  . Minutes of Exercise per Session: Not  on file  Stress:   . Feeling of Stress : Not on file  Social Connections:   . Frequency of Communication with Friends and Family: Not on file  . Frequency of Social Gatherings with Friends and Family: Not on file  . Attends Religious Services: Not on file  . Active Member of Clubs or Organizations: Not on file  . Attends Banker Meetings: Not on file  . Marital Status: Not on file  Intimate Partner Violence:   . Fear of Current or Ex-Partner: Not on file  . Emotionally Abused: Not on file  . Physically Abused: Not on file  . Sexually Abused: Not on file   Family History  Problem Relation Age of Onset  . Stroke Father   . Thyroid disease Father    Current Outpatient Medications on File Prior to Visit  Medication Sig  . Norethindrone Acetate-Ethinyl Estrad-FE (LOESTRIN 24 FE) 1-20 MG-MCG(24) tablet Take 1 tablet by mouth daily. (Patient not taking: Reported on 01/12/2020)   No current facility-administered medications on file prior to visit.    Review of Systems Per HPI unless specifically indicated above      Objective:    BP 127/67   Pulse 84   Temp 97.8 F (36.6 C) (Oral)   Resp 16   Ht 5\' 2"  (1.575 m)   Wt 163 lb 6.4 oz (74.1 kg)   LMP 01/05/2020 (Exact Date)   BMI 29.89 kg/m   Wt Readings from Last 3 Encounters:  01/12/20 163 lb 6.4 oz (74.1 kg)  01/10/20 163 lb (73.9 kg)  12/15/19 160 lb (72.6 kg)    Physical Exam Vitals and nursing note reviewed.  Constitutional:      General: She is not in acute distress.    Appearance: She is well-developed. She is not diaphoretic.     Comments: Well-appearing, comfortable, cooperative  HENT:     Head: Normocephalic and atraumatic.     Right Ear: Ear canal and external ear normal.     Left Ear: Ear canal and external ear normal.     Ears:     Comments: Left TM with bulging and effusion clear fluid, no erythema or opaque fluid. Right ear with TM fullness and clear fluid. Eyes:     General:        Right  eye: No discharge.        Left eye: No discharge.     Conjunctiva/sclera: Conjunctivae normal.  Neck:     Thyroid: No thyromegaly.  Cardiovascular:     Rate and Rhythm: Normal rate and regular rhythm.     Heart sounds: Normal heart sounds. No murmur.  Pulmonary:     Effort: Pulmonary effort is normal. No respiratory distress.     Breath sounds: Normal breath sounds. No wheezing or rales.  Musculoskeletal:        General: Normal range of motion.     Cervical back: Normal range of motion and neck supple.  Lymphadenopathy:     Cervical: No cervical adenopathy (some slight fullness generalized but no distinct LAD).  Skin:    General: Skin is warm and dry.     Findings: No erythema or rash.  Neurological:     Mental Status: She is alert and oriented to person, place, and time.  Psychiatric:        Behavior: Behavior normal.     Comments: Well groomed, good eye contact, normal speech and thoughts    Results for orders placed or performed in visit on 01/10/20  Vaginitis/Vaginosis, DNA Probe   Specimen: Genital   GE  Result Value Ref Range   Candida Species Negative Negative   Gardnerella vaginalis Negative Negative   Trichomonas vaginosis Negative Negative  GC/Chlamydia Probe Amp   Specimen: Genital   GE  Result Value Ref Range   Chlamydia trachomatis, NAA Negative Negative   Neisseria Gonorrhoeae by PCR Negative Negative  TSH  Result Value Ref Range   TSH 0.446 (L) 0.450 - 4.500 uIU/mL  HIV Antibody (routine testing w rflx)  Result Value Ref Range   HIV Screen 4th Generation wRfx Non Reactive Non Reactive  Hepatitis C Antibody  Result Value Ref Range   Hep C Virus Ab <0.1 0.0 - 0.9 s/co ratio  RPR  Result Value Ref Range   RPR Ser Ql Non Reactive Non Reactive  POCT Urinalysis Dipstick  Result Value Ref Range   Color, UA yellow    Clarity, UA clear    Glucose, UA Negative Negative   Bilirubin, UA n    Ketones, UA n    Spec Grav, UA     Blood, UA n    pH, UA 5.0  5.0 - 8.0   Protein, UA Negative Negative   Urobilinogen, UA negative (A) 0.2 or 1.0 E.U./dL   Nitrite, UA n    Leukocytes, UA Negative Negative   Appearance yellow    Odor none       Assessment & Plan:   Problem List Items Addressed This Visit    Eustachian tube dysfunction, bilateral - Primary   Relevant Medications   fluticasone (FLONASE) 50 MCG/ACT nasal spray   Chronic headaches    Other Visit Diagnoses    Encounter to establish care with new doctor       Anxiety          Subacute vs Recurrent L>R bilateral eustachian tube dysfunction with secondary ear effusion, without loss of hearing or evidence of AOM or sinusitis currently but was treated for AOM recently. Likely related allergic rhinosinusitis based on exam and history. S/p antibiotics, likely allergic to cefdinir  Plan: 1. Start Flonase 2 sprays each nare daily for up to 4-6 weeks or longer 2. Continue anti histamine 3. Recommend may consider trial OTC oral decongestant for up to 1 week or less 4. Additionally discussed potential benefit of oral steroid, prednisone taper for short course up to 20mg  daily x 3 days, only if refractory symptoms, counseled on potential side effects - declined today Follow-up if not improved or worsening, return criteria. May consider ENT if need  Recommend may return to Allergist for allergy shots in near future. Suspect headaches may be sinus related  ------------------  #Anxiety Reviewed may be contributing to constellation of symptoms Can be linked to headaches, if neck/upper back muscle tension as  well - Consider conservative therapy massage, heating pad, muscle rub, NSAIDs - We can consider stronger therapy in future if need like muscle relaxant PRN  #Low TSH Unlikely to be hyperthyroidism, with very mild low TSH 0.446, just under 0.450, also had normal result TSH 0.9 range in 11/2019 from Urgent Care. - Symptoms not consistent with hyperthyroid, will monitor and GYN to  recheck in 6 months    Meds ordered this encounter  Medications  . fluticasone (FLONASE) 50 MCG/ACT nasal spray    Sig: Place 2 sprays into both nostrils daily. Use for 4-6 weeks then stop and use seasonally or as needed.    Dispense:  16 g    Refill:  3    Follow up plan: Return in about 4 weeks (around 02/09/2020) for ears / headache / anxiety.  Saralyn Pilar, DO [facility] 01/12/2020, 3:36 PM

## 2020-01-12 NOTE — Telephone Encounter (Signed)
Patient notified of results. See lab 

## 2020-01-12 NOTE — Patient Instructions (Addendum)
Thank you for coming to the office today.  You have some Eustachian Tube Dysfunction, this problem is usually caused by some deeper sinus swelling and pressure, causing difficulty of eustachian tubes to clear fluid from behind ear drum. You can have ear pain, pressure, fullness, loss of hearing. Often related to sinus symptoms and sometimes with sinusitis or infection or allergy symptoms.  Start nasal steroid Flonase 2 sprays in each nostril daily for 4-6 weeks, may repeat course seasonally or as needed  We can offer a short course Prednisone steroid if needed in future. Short course only for sinus swelling and ear pressure fullness.  - For significant ear/sinus pressure, try OTC decongestant such as Phenylephrine (or Behind the Counter Sudafed if needed) - for 1 week or less  Follow up with Allergist for upcoming allergy shots.  For headaches, may get better with sinus nasal spray treatment, or allergy therapy  If due to stress or anxiety or neck tension then this can be relieved with muscle rub, massage therapy, anti inflammatory med or muscle relaxant, we can work on this further if you needed.  I am not worried about low TSH number, this would mean theoretically your thyroid hormone is too HIGH in body but your recent lab from 11/2019 other urgent care was normal thyroid  Future consider anxiety we can review other treatment options.  Please schedule a Follow-up Appointment to: Return in about 4 weeks (around 02/09/2020) for ears / headache / anxiety.  If you have any other questions or concerns, please feel free to call the office or send a message through MyChart. You may also schedule an earlier appointment if necessary.  Additionally, you may be receiving a survey about your experience at our office within a few days to 1 week by e-mail or mail. We value your feedback.  Saralyn Pilar, DO [facility], [place]

## 2020-01-29 ENCOUNTER — Encounter: Payer: Self-pay | Admitting: Certified Nurse Midwife

## 2020-01-31 ENCOUNTER — Encounter: Payer: Self-pay | Admitting: Certified Nurse Midwife

## 2020-02-23 ENCOUNTER — Other Ambulatory Visit: Payer: 59

## 2020-03-18 ENCOUNTER — Encounter: Payer: Self-pay | Admitting: Family Medicine

## 2020-03-18 ENCOUNTER — Ambulatory Visit (INDEPENDENT_AMBULATORY_CARE_PROVIDER_SITE_OTHER): Payer: 59 | Admitting: Family Medicine

## 2020-03-18 ENCOUNTER — Other Ambulatory Visit: Payer: Self-pay

## 2020-03-18 VITALS — BP 121/76 | HR 93 | Temp 98.0°F | Resp 16 | Ht 62.0 in | Wt 160.0 lb

## 2020-03-18 DIAGNOSIS — R1030 Lower abdominal pain, unspecified: Secondary | ICD-10-CM

## 2020-03-18 DIAGNOSIS — G44229 Chronic tension-type headache, not intractable: Secondary | ICD-10-CM

## 2020-03-18 DIAGNOSIS — J302 Other seasonal allergic rhinitis: Secondary | ICD-10-CM

## 2020-03-18 DIAGNOSIS — R0609 Other forms of dyspnea: Secondary | ICD-10-CM | POA: Diagnosis not present

## 2020-03-18 DIAGNOSIS — R7989 Other specified abnormal findings of blood chemistry: Secondary | ICD-10-CM

## 2020-03-18 DIAGNOSIS — Z8616 Personal history of COVID-19: Secondary | ICD-10-CM

## 2020-03-18 DIAGNOSIS — R002 Palpitations: Secondary | ICD-10-CM

## 2020-03-18 DIAGNOSIS — H6983 Other specified disorders of Eustachian tube, bilateral: Secondary | ICD-10-CM

## 2020-03-18 MED ORDER — MONTELUKAST SODIUM 10 MG PO TABS: 10.0000 mg | ORAL_TABLET | Freq: Every day | ORAL | 3 refills | Status: DC

## 2020-03-18 NOTE — Patient Instructions (Addendum)
Thank you for coming to the office today.  Recommend Chest X-ray  Call insurance to check cost of chest x-ray  Dyspnea or shortness of breath R06.09 History of COVID19 infection Z86.16  -------------------------  Start Singulair allergy pill in addition to nose spray and other allergy pill OTC.  Will forward thyroid result to your GYN provider.   DUE for FASTING BLOOD WORK (no food or drink after midnight before the lab appointment, only water or coffee without cream/sugar on the morning of)  SCHEDULE "Lab Only" visit in the morning at the clinic for lab draw WITHIN THIS WEEK  - Make sure Lab Only appointment is at about 1 week before your next appointment, so that results will be available  For Lab Results, once available within 2-3 days of blood draw, you can can log in to MyChart online to view your results and a brief explanation. Also, we can discuss results at next follow-up visit.    Please schedule a Follow-up Appointment to: Return in about 6 weeks (around 04/29/2020) for 4-6 week follow-up if not improved palpitations other symptoms.  If you have any other questions or concerns, please feel free to call the office or send a message through MyChart. You may also schedule an earlier appointment if necessary.  Additionally, you may be receiving a survey about your experience at our office within a few days to 1 week by e-mail or mail. We value your feedback.  Saralyn Pilar, DO [facility], [place]

## 2020-03-18 NOTE — Progress Notes (Signed)
Subjective:    Patient ID: Felicia Lambert, female    DOB: Jul 12, 1991, 29 y.o.   MRN: [id number]  Felicia Lambert is a 29 y.o. female presenting on 03/18/2020 for Palpitations, Abdominal Pain (central mid lower region), and Diarrhea (past two days---Covid positive month ago)   HPI   Headaches, intermittent - Did improve period of time, then seem to have returned. - believes nasal spray is helping - unsure if another ear infection or sinusitis - now without dizziness. Still has some light sensitivity - overall headaches improved, now has some random episodic sinus symptoms Asking about other sinus allergy treatment options  GYN / Pelvic vs Abdominal Lower Pain Restarted OCP 6 weeks ago Followed by Leota Sauers CNM - [facility] - has had some spotting halfway through 2nd pack, has mild abdominal discomfort or lower midline abdomen pelvic discomfort, not linked exactly to menstrual cycle  History of COVID19 Recent history, prior acute infection COVID19, about 1 month ago She has improved and overall recovered now, but still reports some symptoms unsure if related. Admits difficulty taking a full deep breath at times, increased work to take a full breath Also admits some loose stool diarrhea x 2 days  Palpitations / Elevated Heart Rate Describes new problem in past month approximately She would be sitting down and not doing any physical exertion - She has a nurse friend who checks pulse O2 at times, and measuring HR up to 125 with some chest discomfort and also occasionally shortness of breath. Seems to have this sensation of breathing and "discomfort" few times a day. - Usually exercises with yoga at times. - If carrying groceries upstairs can feel winded and palpitations. - She is worried about any changes after COVID19 - no prior history of breathing issues before. Never using albuterol or rescue inhaler prior. - drinking 1 cup coffee day or less  Low TSH Recent lab done  in 12/2019 with mild low TSH at 0.446, no other lab test at that time, she was to repeat this but has not been able to return to GYn due to COVID recently  Depression screen [facility] 2/9 03/18/2020 01/12/2020  Decreased Interest 0 0  Down, Depressed, Hopeless 0 0  PHQ - 2 Score 0 0    Social History   Tobacco Use  . Smoking status: Never Smoker  . Smokeless tobacco: Never Used  Substance Use Topics  . Alcohol use: Yes    Alcohol/week: 5.0 standard drinks    Types: 5 Standard drinks or equivalent per week  . Drug use: No    Review of Systems Per HPI unless specifically indicated above     Objective:    BP 121/76   Pulse 93   Temp 98 F (36.7 C) (Temporal)   Resp 16   Ht 5\' 2"  (1.575 m)   Wt 160 lb (72.6 kg)   SpO2 100%   BMI 29.26 kg/m   Wt Readings from Last 3 Encounters:  03/18/20 160 lb (72.6 kg)  01/12/20 163 lb 6.4 oz (74.1 kg)  01/10/20 163 lb (73.9 kg)    Physical Exam Vitals and nursing note reviewed.  Constitutional:      General: She is not in acute distress.    Appearance: She is well-developed. She is not diaphoretic.     Comments: Well-appearing, comfortable, cooperative  HENT:     Head: Normocephalic and atraumatic.     Right Ear: Tympanic membrane, ear canal and external ear normal.  Left Ear: Ear canal and external ear normal.     Ears:     Comments: Left TM fullness without obvious bulging. No erythema or effusion Eyes:     General:        Right eye: No discharge.        Left eye: No discharge.     Conjunctiva/sclera: Conjunctivae normal.  Neck:     Thyroid: No thyromegaly.  Cardiovascular:     Rate and Rhythm: Normal rate and regular rhythm.     Heart sounds: Normal heart sounds. No murmur.  Pulmonary:     Effort: Pulmonary effort is normal. No respiratory distress.     Breath sounds: Normal breath sounds. No wheezing or rales.     Comments: Speaks full sentences. Some admitted difficulty with taking deep breath on exam but overall able to  move good air diffusely. No focal abnormality Abdominal:     General: Bowel sounds are normal. There is no distension.     Palpations: Abdomen is soft. There is no mass.     Tenderness: There is no abdominal tenderness.     Comments: Localized discomfort is suprapubic midline lower abdomen/pelvic region. Non reproducible on exam.  Musculoskeletal:        General: Normal range of motion.     Cervical back: Normal range of motion and neck supple.  Lymphadenopathy:     Cervical: No cervical adenopathy.  Skin:    General: Skin is warm and dry.     Findings: No erythema or rash.  Neurological:     Mental Status: She is alert and oriented to person, place, and time.  Psychiatric:        Behavior: Behavior normal.     Comments: Well groomed, good eye contact, normal speech and thoughts        Results for orders placed or performed in visit on 01/10/20  Vaginitis/Vaginosis, DNA Probe   Specimen: Genital   GE  Result Value Ref Range   Candida Species Negative Negative   Gardnerella vaginalis Negative Negative   Trichomonas vaginosis Negative Negative  GC/Chlamydia Probe Amp   Specimen: Genital   GE  Result Value Ref Range   Chlamydia trachomatis, NAA Negative Negative   Neisseria Gonorrhoeae by PCR Negative Negative  TSH  Result Value Ref Range   TSH 0.446 (L) 0.450 - 4.500 uIU/mL  HIV Antibody (routine testing w rflx)  Result Value Ref Range   HIV Screen 4th Generation wRfx Non Reactive Non Reactive  Hepatitis C Antibody  Result Value Ref Range   Hep C Virus Ab <0.1 0.0 - 0.9 s/co ratio  RPR  Result Value Ref Range   RPR Ser Ql Non Reactive Non Reactive  POCT Urinalysis Dipstick  Result Value Ref Range   Color, UA yellow    Clarity, UA clear    Glucose, UA Negative Negative   Bilirubin, UA n    Ketones, UA n    Spec Grav, UA     Blood, UA n    pH, UA 5.0 5.0 - 8.0   Protein, UA Negative Negative   Urobilinogen, UA negative (A) 0.2 or 1.0 E.U./dL   Nitrite, UA n      Leukocytes, UA Negative Negative   Appearance yellow    Odor none       Assessment & Plan:   Problem List Items Addressed This Visit    Eustachian tube dysfunction, bilateral   Chronic headaches    Other Visit Diagnoses  Other form of dyspnea    -  Primary   Relevant Orders   DG Chest 2 View   CBC with Differential/Platelet   History of COVID-19       Relevant Orders   DG Chest 2 View   Seasonal allergies       Relevant Medications   montelukast (SINGULAIR) 10 MG tablet   Intermittent lower abdominal pain       Relevant Orders   CBC with Differential/Platelet   COMPLETE METABOLIC PANEL WITH GFR   Intermittent palpitations       Relevant Orders   DG Chest 2 View   COMPLETE METABOLIC PANEL WITH GFR   TSH   T4, free   Low TSH level       Relevant Orders   COMPLETE METABOLIC PANEL WITH GFR   TSH   T4, free      #Intermittent Palpitations / Chest Discomfort / Episodic Dyspnea History of COVID19 Constellation of symptoms, seems episodic, non exertional triggered but exertion seems to make it worse at times, not always predictable however. Question if some deconditioning and post covid syndrome may take time to recover and return to baseline lung function -Check Chemistry, CBC /  Check CXR - order in future, can get with labs upcoming, f/u results - Limit caffeine triggers for palpitations as she is already doing - if persistent tachycardia or palpitations >4-6 weeks still and work up currently is unremarkable, can consider Cardiology consult post COVID19 to determine if any other complication  #Lower Abdominal vs pelvic pain Some new onset lower abdominal discomfort within 6 week of onset back on OCP, has had some spotting after first 4-6 weeks Likely related to her adjusting to OCP, may f/u with GYn if persistent issue.  #Seasonal Allergies / Sinusitis No active sign of sinus infection at this time, ears with mild fullness L ear only no AOM Add Singulair 10mg   nightly Keep Flonase  #Headaches - improving on sinus treatment  #Low TSH See above Will repeat labs with chemistry, TSH Free T4 - will forward to her GYN for review as well. If still abnormal low TSH and abnormal elevated free T4, could explain some symptoms would consider Endocrinology referral for hyperthyroidism if this is the case as indicated by lab results - TBD at this time.  Otherwise, if still very mild abnormal unlikely to be cause, could be subclinical lab abnormality may not warrant treatment  Meds ordered this encounter  Medications  . montelukast (SINGULAIR) 10 MG tablet    Sig: Take 1 tablet (10 mg total) by mouth at bedtime.    Dispense:  30 tablet    Refill:  3    Follow up plan: Return in about 6 weeks (around 04/29/2020) for 4-6 week follow-up if not improved palpitations other symptoms.  Future labs ordered for - TSH Free T4, CMET, CBC, also a Chest X-ray was ordered to follow-up her breathing symptoms.  Will forward lab results to GYN once review Thyroid.  Nobie Putnam, DO [facility] 03/18/2020, 1:51 PM

## 2020-03-19 ENCOUNTER — Other Ambulatory Visit: Payer: 59

## 2020-03-19 ENCOUNTER — Other Ambulatory Visit: Payer: Self-pay | Admitting: Family Medicine

## 2020-03-19 ENCOUNTER — Ambulatory Visit
Admission: RE | Admit: 2020-03-19 | Discharge: 2020-03-19 | Disposition: A | Discharge status: Home / Self Care | Payer: 59 | Source: Ambulatory Visit | Attending: Family Medicine | Admitting: Family Medicine

## 2020-03-19 DIAGNOSIS — R002 Palpitations: Secondary | ICD-10-CM

## 2020-03-19 DIAGNOSIS — R1030 Lower abdominal pain, unspecified: Secondary | ICD-10-CM

## 2020-03-19 DIAGNOSIS — Z8616 Personal history of COVID-19: Secondary | ICD-10-CM | POA: Insufficient documentation

## 2020-03-19 DIAGNOSIS — R0609 Other forms of dyspnea: Secondary | ICD-10-CM

## 2020-03-19 DIAGNOSIS — R7989 Other specified abnormal findings of blood chemistry: Secondary | ICD-10-CM

## 2020-03-19 LAB — CBC WITH DIFFERENTIAL/PLATELET
Absolute Monocytes: 686 cells/uL (ref 200–950)
Basophils Absolute: 28 cells/uL (ref 0–200)
Basophils Relative: 0.3 %
Eosinophils Absolute: 310 cells/uL (ref 15–500)
Eosinophils Relative: 3.3 %
HCT: 39 % (ref 35.0–45.0)
Hemoglobin: 12.7 g/dL (ref 11.7–15.5)
Lymphs Abs: 1607 cells/uL (ref 850–3900)
MCH: 30.1 pg (ref 27.0–33.0)
MCHC: 32.6 g/dL (ref 32.0–36.0)
MCV: 92.4 fL (ref 80.0–100.0)
MPV: 10.6 fL (ref 7.5–12.5)
Monocytes Relative: 7.3 %
Neutro Abs: 6768 cells/uL (ref 1500–7800)
Neutrophils Relative %: 72 %
Platelets: 301 10*3/uL (ref 140–400)
RBC: 4.22 10*6/uL (ref 3.80–5.10)
RDW: 12.1 % (ref 11.0–15.0)
Total Lymphocyte: 17.1 %
WBC: 9.4 10*3/uL (ref 3.8–10.8)

## 2020-03-19 LAB — COMPLETE METABOLIC PANEL WITH GFR
AG Ratio: 1.8 (calc) (ref 1.0–2.5)
ALT: 14 U/L (ref 6–29)
AST: 14 U/L (ref 10–30)
Albumin: 4.4 g/dL (ref 3.6–5.1)
Alkaline phosphatase (APISO): 50 U/L (ref 31–125)
BUN: 10 mg/dL (ref 7–25)
CO2: 22 mmol/L (ref 20–32)
Calcium: 9.4 mg/dL (ref 8.6–10.2)
Chloride: 106 mmol/L (ref 98–110)
Creat: 0.8 mg/dL (ref 0.50–1.10)
GFR, Est African American: 118 mL/min/{1.73_m2} (ref >=60)
GFR, Est Non African American: 102 mL/min/{1.73_m2} (ref >=60)
Globulin: 2.5 g/dL (calc) (ref 1.9–3.7)
Glucose, Bld: 106 mg/dL — ABNORMAL HIGH (ref 65–99)
Potassium: 4.2 mmol/L (ref 3.5–5.3)
Sodium: 137 mmol/L (ref 135–146)
Total Bilirubin: 0.4 mg/dL (ref 0.2–1.2)
Total Protein: 6.9 g/dL (ref 6.1–8.1)

## 2020-03-19 LAB — T4, FREE: Free T4: 1.2 ng/dL (ref 0.8–1.8)

## 2020-03-19 LAB — TSH: TSH: 1.11 mIU/L

## 2020-03-27 ENCOUNTER — Telehealth: Payer: Self-pay | Admitting: *Deleted

## 2020-03-27 NOTE — Telephone Encounter (Signed)
Patient says pharmacy need a prior authorization for birth control loestrin.

## 2020-03-28 NOTE — Telephone Encounter (Signed)
Spoke with [employer] at [employer]. Was advised Rx Loestrin Fe $188 for 90- day supply, was advised Rx covered by mail order pharmacy. Patient should check with her plan.

## 2020-03-28 NOTE — Telephone Encounter (Signed)
Left message to call Elric Tirado, RN at [facility] [phone].   

## 2020-03-28 NOTE — Telephone Encounter (Signed)
Spoke with patient, advised as seen below per pharmacy. Patient will contact health plan to review benefits and confirm mail order pharmacy benefits. Patient to return call to provide update. Per review of insurance card, pharmacy is [facility].

## 2020-03-28 NOTE — Telephone Encounter (Signed)
Patient is returning call. Patient stated she would like to be called back between 1pm - 1:30pm today.

## 2020-04-03 ENCOUNTER — Telehealth: Payer: Self-pay

## 2020-04-03 NOTE — Telephone Encounter (Signed)
Spoke with patient. Patient states she contacted [facility], but she is still unclear what the problem is and is unsure what she needs to do. States she has not set up mail order. Patient asking if she can use Good[facility] or fill as 1 month supply? Confirmed patient is filling Rx as aurovela Fe 1/20. Reviewed [facility], approximately $32.41 for 3 mo supply at [facility]. Advised I will return call to [facility] to confirm and return call. Patient request detailed message upon return call, she is working.    Call placed to [facility], spoke with Joe. Was advised Rx is being filled as generic Aurovela, out of pocket cost $188, to be covered by plan needs to be filled through mail order, no PA required. Can use saving coupon.    Call placed to patient, left detailed message per patient request, advised as seen above. Return call to office if any additional questions, recommended patient f/u with her plan to review pharmacy benefits.   Routing to provider for final review. Patient is agreeable to disposition. Will close encounter.

## 2020-04-03 NOTE — Telephone Encounter (Signed)
Patient is returning call to Jill. °

## 2020-04-03 NOTE — Telephone Encounter (Signed)
Left message to call Jill, RN at [facility] [phone].   

## 2020-04-03 NOTE — Telephone Encounter (Signed)
Call to patient, left detailed message, ok per dpr. Advised patient f/u to confirm she was able to fill her OCP Rx. Return call to office if any additional assistance needed.   Routing to Dr. Gloris Ham.   Encounter closed.

## 2020-04-18 ENCOUNTER — Other Ambulatory Visit: Payer: Self-pay

## 2020-04-18 ENCOUNTER — Ambulatory Visit
Admission: EM | Admit: 2020-04-18 | Discharge: 2020-04-18 | Disposition: A | Discharge status: Home / Self Care | Payer: 59

## 2020-04-18 ENCOUNTER — Encounter: Payer: Self-pay | Admitting: Emergency Medicine

## 2020-04-18 DIAGNOSIS — B349 Viral infection, unspecified: Secondary | ICD-10-CM | POA: Diagnosis not present

## 2020-04-18 NOTE — ED Provider Notes (Signed)
Roderic Palau    CSN: [id number] Arrival date & time: 04/18/20  1232      History   Chief Complaint Chief Complaint  Patient presents with  . Cough    HPI Felicia Lambert is a 29 y.o. female.   Patient presents with 2-week history of nonproductive cough and congestion.  She also reports headache and dizziness x5 months.  She reports having COVID in April 2021.  She denies fever, chills, sore throat, ear pain, shortness of breath, vomiting, diarrhea, rash, or other symptoms.  Treatment attempted at home with Tylenol.  Patient is concerned because her father was diagnosed with DVT and PE 2 days ago.  Patient has not received the COVID vaccines yet but plans to get them once she is 3 months out from having COVID.  The history is provided by the patient.    History reviewed. No pertinent past medical history.  Patient Active Problem List   Diagnosis Date Noted  . Eustachian tube dysfunction, bilateral 01/12/2020  . Chronic headaches 10/27/2019  . Acute bacterial sinusitis 10/17/2014  . Allergy to other foods, tree nut allergy 10/23/2013    Past Surgical History:  Procedure Laterality Date  . TONSILLECTOMY  2014  . WISDOM TOOTH EXTRACTION  2011    OB History    Gravida  1   Para  0   Term  0   Preterm  0   AB  1   Living  0     SAB  0   TAB  1   Ectopic  0   Multiple  0   Live Births               Home Medications    Prior to Admission medications   Medication Sig Start Date End Date Taking? Authorizing Provider  fluticasone (FLONASE) 50 MCG/ACT nasal spray Place 2 sprays into both nostrils daily. Use for 4-6 weeks then stop and use seasonally or as needed. 01/12/20  Yes Karamalegos, Devonne Doughty, DO  Norethindrone Acetate-Ethinyl Estrad-FE (LOESTRIN 24 FE) 1-20 MG-MCG(24) tablet Take 1 tablet by mouth daily. 01/10/20  Yes Regina Eck, CNM  montelukast (SINGULAIR) 10 MG tablet Take 1 tablet (10 mg total) by mouth at bedtime. 03/18/20    Olin Hauser, DO    Family History Family History  Problem Relation Age of Onset  . Stroke Father   . Thyroid disease Father   . Pulmonary embolism Father   . Deep vein thrombosis Father     Social History Social History   Tobacco Use  . Smoking status: Never Smoker  . Smokeless tobacco: Never Used  Substance Use Topics  . Alcohol use: Yes    Alcohol/week: 5.0 standard drinks    Types: 5 Standard drinks or equivalent per week  . Drug use: No     Allergies   Peanut-containing drug products, Amoxicillin, Apple, Avocado, Cefdinir, Seasonal ic [cholestatin], and Watermelon flavor   Review of Systems Review of Systems  Constitutional: Negative for chills and fever.  HENT: Positive for congestion. Negative for ear pain and sore throat.   Eyes: Negative for pain and visual disturbance.  Respiratory: Positive for cough. Negative for shortness of breath.   Cardiovascular: Negative for chest pain and palpitations.  Gastrointestinal: Negative for abdominal pain, diarrhea and vomiting.  Genitourinary: Negative for dysuria and hematuria.  Musculoskeletal: Negative for arthralgias and back pain.  Skin: Negative for color change and rash.  Neurological: Positive for dizziness and headaches. Negative for  seizures, syncope, facial asymmetry, speech difficulty and weakness.  All other systems reviewed and are negative.    Physical Exam Triage Vital Signs ED Triage Vitals  Enc Vitals Group     BP 04/18/20 1240 122/81     Pulse Rate 04/18/20 1240 99     Resp 04/18/20 1240 18     Temp 04/18/20 1240 99.4 F (37.4 C)     Temp Source 04/18/20 1240 Oral     SpO2 04/18/20 1240 99 %     Weight 04/18/20 1238 160 lb 0.9 oz (72.6 kg)     Height 04/18/20 1238 5\' 2"  (1.575 m)     Head Circumference --      Peak Flow --      Pain Score 04/18/20 1236 2     Pain Loc --      Pain Edu? --      Excl. in GC? --    No data found.  Updated Vital Signs BP 122/81 (BP Location:  Left Arm)   Pulse 99   Temp 99.4 F (37.4 C) (Oral)   Resp 18   Ht 5\' 2"  (1.575 m)   Wt 160 lb 0.9 oz (72.6 kg)   LMP 03/28/2020 (Approximate)   SpO2 99%   BMI 29.27 kg/m   Visual Acuity Right Eye Distance:   Left Eye Distance:   Bilateral Distance:    Right Eye Near:   Left Eye Near:    Bilateral Near:     Physical Exam Vitals and nursing note reviewed.  Constitutional:      General: She is not in acute distress.    Appearance: She is well-developed. She is not ill-appearing.  HENT:     Head: Normocephalic and atraumatic.     Right Ear: Tympanic membrane normal.     Left Ear: Tympanic membrane normal.     Nose: Nose normal.     Mouth/Throat:     Mouth: Mucous membranes are moist.     Pharynx: Oropharynx is clear.  Eyes:     Conjunctiva/sclera: Conjunctivae normal.  Cardiovascular:     Rate and Rhythm: Normal rate and regular rhythm.     Heart sounds: Normal heart sounds. No murmur.  Pulmonary:     Effort: Pulmonary effort is normal. No respiratory distress.     Breath sounds: Normal breath sounds.  Abdominal:     General: Bowel sounds are normal.     Palpations: Abdomen is soft.     Tenderness: There is no abdominal tenderness. There is no guarding or rebound.  Musculoskeletal:        General: Normal range of motion.     Cervical back: Neck supple.     Right lower leg: No edema.     Left lower leg: No edema.  Skin:    General: Skin is warm and dry.     Findings: No rash.  Neurological:     General: No focal deficit present.     Mental Status: She is alert and oriented to person, place, and time.     Sensory: No sensory deficit.     Motor: No weakness.     Gait: Gait normal.  Psychiatric:        Mood and Affect: Mood normal.        Behavior: Behavior normal.      UC Treatments / Results  Labs (all labs ordered are listed, but only abnormal results are displayed) Labs Reviewed - No data to display  EKG  Radiology No results  found.  Procedures Procedures (including critical care time)  Medications Ordered in UC Medications - No data to display  Initial Impression / Assessment and Plan / UC Course  I have reviewed the triage vital signs and the nursing notes.  Pertinent labs & imaging results that were available during my care of the patient were reviewed by me and considered in my medical decision making (see chart for details).   Viral illness.  Patient is well-appearing and her exam is reassuring.  Symptomatic treatment with Tylenol and Mucinex.  Instructed her to follow-up with her PCP if her symptoms or not improving.  Patient agrees to plan of care.       Final Clinical Impressions(s) / UC Diagnoses   Final diagnoses:  Viral illness     Discharge Instructions     Take Tylenol as needed for discomfort.  Take Mucinex as needed for congestion.    Follow up with your primary care provider if your symptoms are not improving.        ED Prescriptions    None     PDMP not reviewed this encounter.   Mickie Bail, NP 04/18/20 1318

## 2020-04-18 NOTE — Discharge Instructions (Signed)
Take Tylenol as needed for discomfort.  Take Mucinex as needed for congestion.    Follow up with your primary care provider if your symptoms are not improving.

## 2020-04-18 NOTE — ED Triage Notes (Signed)
Pt c/o cough, chest congestion, headache, dizziness. Started about 2 weeks ago. She states she has been having issues with the headache and dizziness for months. She states she had an ear infection last time she felt the headache. No ear pain. She states she had covid at the beginning of April.  She is also concerned about blood clots as her father was just admitted for PE and DVT.

## 2020-04-24 ENCOUNTER — Encounter: Payer: Self-pay | Admitting: Family Medicine

## 2020-04-24 ENCOUNTER — Ambulatory Visit (INDEPENDENT_AMBULATORY_CARE_PROVIDER_SITE_OTHER): Payer: 59 | Admitting: Family Medicine

## 2020-04-24 ENCOUNTER — Other Ambulatory Visit: Payer: Self-pay

## 2020-04-24 VITALS — BP 115/76 | HR 91 | Temp 97.7°F | Resp 16 | Ht 62.0 in | Wt 155.6 lb

## 2020-04-24 DIAGNOSIS — G44219 Episodic tension-type headache, not intractable: Secondary | ICD-10-CM

## 2020-04-24 DIAGNOSIS — J302 Other seasonal allergic rhinitis: Secondary | ICD-10-CM | POA: Diagnosis not present

## 2020-04-24 DIAGNOSIS — R55 Syncope and collapse: Secondary | ICD-10-CM | POA: Diagnosis not present

## 2020-04-24 DIAGNOSIS — G5622 Lesion of ulnar nerve, left upper limb: Secondary | ICD-10-CM | POA: Diagnosis not present

## 2020-04-24 NOTE — Progress Notes (Signed)
Subjective:    Patient ID: Felicia Lambert, female    DOB: [birth year], 29 y.o.   MRN: [id number]  Felicia Lambert is a 29 y.o. female presenting on 04/24/2020 for Headache (recurrent ear and sinus infection don't know if she has mold build up in her old apartment denies fainting and dizziness for now)   HPI   Chronic Recurrent Headache Episodic Near fainting episode Left hand/arm tingling episodes Possible Mold expsoure  Reports episode last Thursday while at work she felt sudden wave of faintness, she was sitting down and not doing anything active at the time, she felt warm and saw stars for a minute then it resolved, she had a residual shakiness afterward. Mild dizziness for day or two after, slight head fullness. - History of fainting as child and issues hypoglycemia. She did not have yogurt that day or prior day, unsure if blood sugar issue. - Recent vacation Fri - thru yesterday 04/23/20. Unsure why improved. She also has had some anxiety with her father being sick and hospitalized with DVT recently affecting her. - Also lives in old apartment concern about mold as possibility, she will investigate this - Recent Urgent Care visit on 04/18/20 thought to be Post-COVID19 symptoms - Admits still having headaches last week, overall was improved while at beach for last week. Seems to be frontal headache now, also asking if it is computer related - often happening at work - Admits recent sinus congestion for past 1-2 weeks, she was taking OTC decongestant with some relief, improved at beach now not bothering her much only in AM. Improved atypical chest pains and palpitations. Side effect on Singulair - weird dreams, stopped taking this. She is following mostly vegetarian diet with some seafood, she will check her vitamins to see if she is taking Vitamin B12. Denies any persistent numbness tingling, injury fall   Depression screen [facility] 2/9 03/18/2020 01/12/2020  Decreased Interest 0 0  Down, Depressed,  Hopeless 0 0  PHQ - 2 Score 0 0    Social History   Tobacco Use   Smoking status: Never Smoker   Smokeless tobacco: Never Used  Substance Use Topics   Alcohol use: Yes    Alcohol/week: 5.0 standard drinks    Types: 5 Standard drinks or equivalent per week   Drug use: No    Review of Systems Per HPI unless specifically indicated above     Objective:    BP 115/76    Pulse 91    Temp 97.7 F (36.5 C) (Temporal)    Resp 16    Ht 5\' 2"  (1.575 m)    Wt 155 lb 9.6 oz (70.6 kg)    LMP 03/28/2020 (Approximate)    SpO2 100%    BMI 28.46 kg/m   Wt Readings from Last 3 Encounters:  04/24/20 155 lb 9.6 oz (70.6 kg)  04/18/20 160 lb 0.9 oz (72.6 kg)  03/18/20 160 lb (72.6 kg)    Physical Exam Vitals and nursing note reviewed.  Constitutional:      General: She is not in acute distress.    Appearance: She is well-developed. She is not diaphoretic.     Comments: Well-appearing, comfortable, cooperative  HENT:     Head: Normocephalic and atraumatic.     Right Ear: Tympanic membrane, ear canal and external ear normal.     Left Ear: Tympanic membrane, ear canal and external ear normal.  Eyes:     General:        Right eye:  No discharge.        Left eye: No discharge.     Conjunctiva/sclera: Conjunctivae normal.  Neck:     Thyroid: No thyromegaly.  Cardiovascular:     Rate and Rhythm: Normal rate and regular rhythm.     Heart sounds: Normal heart sounds. No murmur.  Pulmonary:     Effort: Pulmonary effort is normal. No respiratory distress.     Breath sounds: Normal breath sounds. No wheezing or rales.  Musculoskeletal:        General: Normal range of motion.     Cervical back: Normal range of motion and neck supple.  Lymphadenopathy:     Cervical: No cervical adenopathy.  Skin:    General: Skin is warm and dry.     Findings: No erythema or rash.  Neurological:     General: No focal deficit present.     Mental Status: She is alert and oriented to person, place, and  time. Mental status is at baseline.     Sensory: No sensory deficit.     Motor: No weakness.     Comments: Distal sensation to touch intact bilateral, describes Ulnar nerve distribution when having symptoms L 4th and 5th finger  Psychiatric:        Behavior: Behavior normal.     Comments: Well groomed, good eye contact, normal speech and thoughts       Results for orders placed or performed in visit on 03/19/20  T4, free  Result Value Ref Range   Free T4 1.2 0.8 - 1.8 ng/dL  TSH  Result Value Ref Range   TSH 1.11 mIU/L  COMPLETE METABOLIC PANEL WITH GFR  Result Value Ref Range   Glucose, Bld 106 (H) 65 - 99 mg/dL   BUN 10 7 - 25 mg/dL   Creat 0.35 4.65 - 6.81 mg/dL   GFR, Est Non African American 102 > OR = 60 mL/min/1.34m2   GFR, Est African American 118 > OR = 60 mL/min/1.31m2   BUN/Creatinine Ratio NOT APPLICABLE 6 - 22 (calc)   Sodium 137 135 - 146 mmol/L   Potassium 4.2 3.5 - 5.3 mmol/L   Chloride 106 98 - 110 mmol/L   CO2 22 20 - 32 mmol/L   Calcium 9.4 8.6 - 10.2 mg/dL   Total Protein 6.9 6.1 - 8.1 g/dL   Albumin 4.4 3.6 - 5.1 g/dL   Globulin 2.5 1.9 - 3.7 g/dL (calc)   AG Ratio 1.8 1.0 - 2.5 (calc)   Total Bilirubin 0.4 0.2 - 1.2 mg/dL   Alkaline phosphatase (APISO) 50 31 - 125 U/L   AST 14 10 - 30 U/L   ALT 14 6 - 29 U/L  CBC with Differential/Platelet  Result Value Ref Range   WBC 9.4 3.8 - 10.8 Thousand/uL   RBC 4.22 3.80 - 5.10 Million/uL   Hemoglobin 12.7 11.7 - 15.5 g/dL   HCT 27.5 17.0 - 01.7 %   MCV 92.4 80.0 - 100.0 fL   MCH 30.1 27.0 - 33.0 pg   MCHC 32.6 32.0 - 36.0 g/dL   RDW 49.4 49.6 - 75.9 %   Platelets 301 140 - 400 Thousand/uL   MPV 10.6 7.5 - 12.5 fL   Neutro Abs 6,768 1,500 - 7,800 cells/uL   Lymphs Abs 1,607 850 - 3,900 cells/uL   Absolute Monocytes 686 200 - 950 cells/uL   Eosinophils Absolute 310 15 - 500 cells/uL   Basophils Absolute 28 0 - 200 cells/uL   Neutrophils Relative %  72 %   Total Lymphocyte 17.1 %   Monocytes Relative  7.3 %   Eosinophils Relative 3.3 %   Basophils Relative 0.3 %      Assessment & Plan:   Problem List Items Addressed This Visit    None    Visit Diagnoses    Vasovagal near syncope    -  Primary   Seasonal allergies       Episodic tension-type headache, not intractable       Ulnar nerve compression, left          Constellation of symptoms, seems to be multiple possible etiology but overall currently seems improved.  Near syncope - seems most consistent with vasovagal episode. Possibly low sugar or stress or other factors, one time episode, short lived and now resolved. - Reviewed this likely diagnosis. Her other symptoms had improved no more palpitations or atypical chests pains or other concerns. - Reassuring. She stays hydrated, discussed avoid longer time without eating, history of hypoglycemia in past.  Headaches Possibly allergy sinus related. Also considering mold exposure at home apartment, she has suspicion and will look into it now, she felt better when away from apartment for week on vacation at beach. Now some return of symptoms when returned. - Also thinks could be computer screen, this is possible contributing factor to a tension type headache - Infrequent or limited caffeine, seems less likely for rebound headaches.  Nerve compression, Ulnar L Based on location of symptoms, has mild compressive symptoms with paresthesia and tingling can be from resting on elbow at work while typing and using computer, discussed offloading ulnar nerve.  However, regarding nerve related symptoms - discussed my concern for possible B12 deficiency as well, given her diet she does eat some seafood and takes vitamins, she should check if taking Vitamin B12 and take supplement if not on it. This may help resolve this issue if it is deficiency could explain some of her neurological symptoms. - Offered to check blood test for B12 today will defer, and she can check for supplements.  Seasonal  allergies/sinuses Less likely cause, now seems improved or resolved, improved away from home  Anxiety vs adjustment - discussed and she is comfortable currently, goal to change job in future. She says she can cope with some mild anxiety and not interested in medicines or therapy.  Follow-up if unresolved we can consider Neurology referral for headache syndrome, or possibly ENT if recurrent sinusitis issues.  No orders of the defined types were placed in this encounter.     Follow up plan: Return if symptoms worsen or fail to improve, for headaches, sinus, dizzy.   Saralyn Pilar, DO [facility] 04/24/2020, 8:40 AM

## 2020-04-24 NOTE — Patient Instructions (Addendum)
Thank you for coming to the office today.  1. As discussed, I do not know the exact cause of your near passing out episode, usually we divide this into either concerning or less concerning syncopal episodes. The most common type is Vasovagal Syncope, often described as you did with feeling flushed or sweating, lightheaded or dizzy, usually these are random episodes, triggered by a variety of causes (can be stress, emotional, physical, straining with exercise or bowel movement even, dehydration poor intake). Still a medical concern, but usually more of a benign problem, there is limited treatment or testing to be done for this type of syncope.  The concerning syncope is either caused by Cardiac (Heart) or Neurogenic (Brain), usually provoked by exertional activity, associated with high risk symptoms chest pain, tightness or pressure, shortness of breath, headache, or stroke like symptoms significant facial or arm/leg weakness, numbness, or related to seizure activity - if you develop any of these symptoms seek help immediately at hospital ED.  From now on, be mindful of possible syncopal episodes, try to be mindful of fluid intake and maintain good hydration as well as try to avoid 4+ hours without eating anything and make sure to avoid low sugar. If you eat something promptly and it feels better then that certainly can be the cause.  If syncopal episode occurs again without any of the above significant red flag symptoms, and it resolves on it's own and you don't feel persistently sick, then you may notify our office, follow-up in our office  ---------------  Left hand finger tingling / numbness episodes can possibly be Ulnar Nerve compression - look into this as it seems to be the right area, may be with elbow pressure or leaning on it at work or other times during the day, can do an elbow wrap or protective sleeve or at night can use a towel to off load direct pressure on this area.  Also consider  B12 deficiency, try to make sure you have B12 supplement up to 1000mg  once daily is a good level if you need to increase it.  ---------------  For mold concerns - I do strongly suggest that you investigate if there is any potential mold and see about getting it cleaned or treated, especially if you felt better out of the house.  For sinuses, I think they are clear at the moment, it does not sound like recurrent sinus infection.  In future if needed we can refer to Neurologist or ENT depending on which symptoms bother you more.   Please schedule a Follow-up Appointment to: Return if symptoms worsen or fail to improve, for headaches, sinus, dizzy.  If you have any other questions or concerns, please feel free to call the office or send a message through MyChart. You may also schedule an earlier appointment if necessary.  Additionally, you may be receiving a survey about your experience at our office within a few days to 1 week by e-mail or mail. We value your feedback.  , DO [facility], [facility]

## 2020-04-25 ENCOUNTER — Ambulatory Visit: Payer: 59 | Admitting: Family Medicine

## 2020-06-13 ENCOUNTER — Other Ambulatory Visit: Payer: Self-pay | Admitting: Family Medicine

## 2020-06-13 DIAGNOSIS — H6983 Other specified disorders of Eustachian tube, bilateral: Secondary | ICD-10-CM

## 2020-07-08 ENCOUNTER — Telehealth: Payer: Self-pay

## 2020-07-08 ENCOUNTER — Encounter: Payer: Self-pay | Admitting: Obstetrics & Gynecology

## 2020-07-08 ENCOUNTER — Other Ambulatory Visit: Payer: Self-pay

## 2020-07-08 ENCOUNTER — Ambulatory Visit: Payer: 59 | Admitting: Obstetrics & Gynecology

## 2020-07-08 VITALS — BP 110/70 | HR 68 | Resp 16 | Wt 154.0 lb

## 2020-07-08 DIAGNOSIS — Z113 Encounter for screening for infections with a predominantly sexual mode of transmission: Secondary | ICD-10-CM

## 2020-07-08 DIAGNOSIS — N898 Other specified noninflammatory disorders of vagina: Secondary | ICD-10-CM

## 2020-07-08 DIAGNOSIS — N390 Urinary tract infection, site not specified: Secondary | ICD-10-CM

## 2020-07-08 LAB — POCT URINALYSIS DIPSTICK
Bilirubin, UA: NEGATIVE
Blood, UA: NEGATIVE
Glucose, UA: NEGATIVE
Ketones, UA: NEGATIVE
Leukocytes, UA: NEGATIVE
Nitrite, UA: NEGATIVE
Protein, UA: NEGATIVE
Urobilinogen, UA: NEGATIVE E.U./dL — AB
pH, UA: 5 (ref 5.0–8.0)

## 2020-07-08 NOTE — Telephone Encounter (Signed)
Spoke with patient. Patient reports she had unprotected intercourse 1 wk ago with new partner. She is unsure if there was bloody d/c or blood in urine on 8/20. Reports vaginal odor and urinary urgency. Intermittent pelvic cramping. Requesting OV.   Last AEX w/ Leota Sauers, CNM 01/10/20  OV scheduled for today at 10:30am with Dr. Hyacinth Meeker.  Patient is agreeable to date and time.  Encounter closed.

## 2020-07-08 NOTE — Progress Notes (Signed)
GYNECOLOGY  VISIT  CC:   Bladder symptoms, desires STD testing  HPI: 29 y.o. [place] or [place] Native female for three day complaint of noting a little blood when she wiped after urination.  She is having some increased odor in her urine.  She does think the bleeding is spotting.  She has checked her temp and has been afebrile.  She is having a little low pelvic cramping that is intermittent.  She is having a little mucous discharge.  female here for std check & urinary symptoms.  She did have unprotected intercourse, she wants STD testing as well.  She has not taken anything OTC.  She has not had any pain with urination.  This is her third month of restarting her Loestrin 24.  She typically has a 4-5 day cycle and does not typically have irregular spotting.    Pt is not sure if had oral sex.  Had been consuming alcohol.  Did not know partner.  Just met that day.  poct urine-neg  GYNECOLOGIC HISTORY: Patient's last menstrual period was 06/22/2020 (exact date). Contraception: loestrin Menopausal hormone therapy: none  Patient Active Problem List   Diagnosis Date Noted  . Eustachian tube dysfunction, bilateral 01/12/2020  . Chronic headaches 10/27/2019  . Acute bacterial sinusitis 10/17/2014  . Allergy to other foods, tree nut allergy 10/23/2013    Past Medical History:  Diagnosis Date  . COVID-19    02/2020    Past Surgical History:  Procedure Laterality Date  . TONSILLECTOMY  2014  . WISDOM TOOTH EXTRACTION  2011    MEDS:   Current Outpatient Medications on File Prior to Visit  Medication Sig Dispense Refill  . fluticasone (FLONASE) 50 MCG/ACT nasal spray USE 2 SPRAYS INTO BOTH NOSTRILS EVERY DAY; USE FOR 4- 6 WEEKS THEN STOP AND USE SEASONALLY OR AS NEEDED 16 g 3  . Norethindrone Acetate-Ethinyl Estrad-FE (LOESTRIN 24 FE) 1-20 MG-MCG(24) tablet Take 1 tablet by mouth daily. 3 Package 3   No current facility-administered medications on file prior to  visit.    ALLERGIES: Peanut-containing drug products, Amoxicillin, Apple, Avocado, Cefdinir, Seasonal ic [cholestatin], and Watermelon flavor  Family History  Problem Relation Age of Onset  . Stroke Father   . Thyroid disease Father   . Pulmonary embolism Father   . Deep vein thrombosis Father     SH:  Single, non smoker  Review of Systems  Constitutional: Negative.   HENT: Negative.   Eyes: Negative.   Respiratory: Negative.   Cardiovascular: Negative.   Gastrointestinal: Negative.   Endocrine: Negative.   Genitourinary:       Vaginal spotting, odor, abdominal cramping, weird feeling in throat  Musculoskeletal: Negative.   Skin: Negative.   Allergic/Immunologic: Negative.   Neurological: Negative.   Hematological: Negative.   Psychiatric/Behavioral: Negative.     PHYSICAL EXAMINATION:    BP 110/70   Pulse 68   Resp 16   Wt 154 lb (69.9 kg)   LMP 06/22/2020 (Exact Date)   BMI 28.17 kg/m     General appearance: alert, cooperative and appears stated age Abdomen: soft, non-tender; bowel sounds normal; no masses,  no organomegaly Lymph:  no inguinal LAD noted  Pelvic: External genitalia:  no lesions              Urethra:  normal appearing urethra with no masses, tenderness or lesions              Bartholins and Skenes: normal  Vagina: normal appearing vagina with normal color and discharge, no lesions              Cervix: no lesions              Bimanual Exam:  Uterus:  normal size, contour, position, consistency, mobility, non-tender              Adnexa: no mass, fullness, tenderness              Chaperone, Olene Floss, CMA, was present for exam.  Assessment: Unprotected intercourse, desires STD testing Mild vaginal discharge Irregular bleeding per hx, none on exam today Change in urine odor H/o DVT/PE/stroke with father  Plan: Urine culture pending Vaginitis testing including GC/Chl/trich obtained today HIV, RPR, Hep C obtained today.   Has done Hep B vaccine series. Asked pt about possible clotting d/o in family.  States her sister knows more about father's hx.  She will ask sister and communicate back with me about this.  D/w pt importance of knowing if there is clotting risk as could mean she needs additional blood testing/work and possibly changing contraception method. Does not need RF for OCPs as was refilled for entire year from [place], [employer].

## 2020-07-08 NOTE — Telephone Encounter (Signed)
Patient is calling in regards to "having infection" and stated there is blood in urine.

## 2020-07-09 ENCOUNTER — Encounter: Payer: Self-pay | Admitting: Obstetrics & Gynecology

## 2020-07-09 ENCOUNTER — Telehealth: Payer: Self-pay | Admitting: Obstetrics & Gynecology

## 2020-07-09 LAB — RPR: RPR Ser Ql: NONREACTIVE

## 2020-07-09 LAB — HIV ANTIBODY (ROUTINE TESTING W REFLEX): HIV Screen 4th Generation wRfx: NONREACTIVE

## 2020-07-09 LAB — HEPATITIS C ANTIBODY: Hep C Virus Ab: 0.1 s/co ratio (ref 0.0–0.9)

## 2020-07-09 NOTE — Telephone Encounter (Signed)
Spoke with pt. Pt has seen POCT urine results and wanting to know what the urobilinogen result was. Pt advised as negative and also advised that UC and vaginitis swab not resulted yet. Will return call when back. Pt agreeable and verbalized understanding.   Encounter closed.

## 2020-07-09 NOTE — Telephone Encounter (Signed)
Left message for pt to return call to triage RN. 

## 2020-07-09 NOTE — Telephone Encounter (Signed)
Felicia Lambert  P [facility] I see that there is an issue with the urobilinogen result in my urinalysis. Should I follow up with this or get something else checked?

## 2020-07-10 LAB — URINE CULTURE

## 2020-07-11 ENCOUNTER — Other Ambulatory Visit: Payer: Self-pay | Admitting: Obstetrics & Gynecology

## 2020-07-11 LAB — NUSWAB VAGINITIS PLUS (VG+)
Atopobium vaginae: HIGH Score — AB
Candida albicans, NAA: POSITIVE — AB
Candida glabrata, NAA: NEGATIVE
Chlamydia trachomatis, NAA: NEGATIVE
Megasphaera 1: HIGH Score — AB
Neisseria gonorrhoeae, NAA: NEGATIVE
Trich vag by NAA: NEGATIVE

## 2020-07-11 NOTE — Telephone Encounter (Signed)
Spoke with pt. Pt states seeing results on Mychart from 07/08/20. Pt advised will review with Dr Hyacinth Meeker and return call with results and recommendations. Pt agreeable.   Routing to Dr Hyacinth Meeker.

## 2020-07-11 NOTE — Telephone Encounter (Signed)
-----   Message from Jerene Bears, MD sent at 07/11/2020  4:11 PM EDT ----- Please let pt know her RPR, HIV, and Hep C were negative.  Urine culture was negative.  Vaginitis testing was negative for trich, GC or chl but it did show both yeast and BV.  Would recommend treating with metrogl 0.75% nightly x 5 nights and with diflucan 150mg  po x 1, repeat 72 hours.  #2/0RF.  Thanks.  Does not need follow up if symptoms fully resolve.  Thanks.

## 2020-07-11 NOTE — Telephone Encounter (Signed)
Left message for call back.

## 2020-07-11 NOTE — Telephone Encounter (Signed)
Patient calling to discuss lab results.

## 2020-07-12 MED ORDER — METRONIDAZOLE 0.75 % VA GEL: 1.0000 | Freq: Every day | VAGINAL | 0 refills | Status: AC

## 2020-07-12 MED ORDER — FLUCONAZOLE 150 MG PO TABS: ORAL_TABLET | ORAL | 0 refills | Status: DC

## 2020-07-12 NOTE — Telephone Encounter (Signed)
Patient notified of results & rxs sent to pharmacy.

## 2020-07-12 NOTE — Telephone Encounter (Signed)
Patient returned a call to Joy. °

## 2020-07-24 ENCOUNTER — Other Ambulatory Visit: Payer: Self-pay

## 2020-07-24 ENCOUNTER — Encounter: Payer: Self-pay | Admitting: Family Medicine

## 2020-07-24 ENCOUNTER — Ambulatory Visit (INDEPENDENT_AMBULATORY_CARE_PROVIDER_SITE_OTHER): Payer: 59 | Admitting: Family Medicine

## 2020-07-24 VITALS — BP 117/75 | HR 84 | Temp 97.7°F | Resp 16 | Ht 62.0 in | Wt 158.0 lb

## 2020-07-24 DIAGNOSIS — H6983 Other specified disorders of Eustachian tube, bilateral: Secondary | ICD-10-CM

## 2020-07-24 DIAGNOSIS — B379 Candidiasis, unspecified: Secondary | ICD-10-CM | POA: Diagnosis not present

## 2020-07-24 DIAGNOSIS — J0100 Acute maxillary sinusitis, unspecified: Secondary | ICD-10-CM

## 2020-07-24 MED ORDER — PREDNISONE 20 MG PO TABS: 40.0000 mg | ORAL_TABLET | Freq: Every day | ORAL | 0 refills | Status: DC

## 2020-07-24 MED ORDER — LEVOFLOXACIN 500 MG PO TABS: 500.0000 mg | ORAL_TABLET | Freq: Every day | ORAL | 0 refills | Status: DC

## 2020-07-24 MED ORDER — FLUCONAZOLE 150 MG PO TABS: ORAL_TABLET | ORAL | 0 refills | Status: DC

## 2020-07-24 NOTE — Progress Notes (Signed)
Subjective:    Patient ID: Felicia Lambert, female    DOB: [birth year], 29 y.o.   MRN: [id number]  Felicia Lambert is a 29 y.o. female presenting on 07/24/2020 for Sinusitis (ear pain, sore throat onset 2 weeks)   HPI   Acute Sinusitis / Ear Pressure pain Reports no known sick contacts, but has had some coworkers children out sick but no other direct contact they have tested negative for COVID She did a home test COVID yesterday 9/7 and it was negative Describes sinus pressure pain, drainage, sore throat from drainage, both ears with fullness and pressure feels like fluid - has not tried mucinex but usually works for her  History of COVID19 in 02/2020.  Additional update - previous Headaches - frequency and severity less since taking Vitamin B12 supplement  Admits seasonal allergies - admits some concern with work / office being older building and dusty, triggers her symptoms  Admits epistaxis or blood tinged nasal drainage, she is still using Flonase daily.  Denies fever chills, sweats body aches, cough dyspnea, nausea vomiting diarrhea, change of taste or smell   Depression screen [facility] 2/9 03/18/2020 01/12/2020  Decreased Interest 0 0  Down, Depressed, Hopeless 0 0  PHQ - 2 Score 0 0    Social History   Tobacco Use  . Smoking status: Never Smoker  . Smokeless tobacco: Never Used  Vaping Use  . Vaping Use: Never used  Substance Use Topics  . Alcohol use: Yes    Alcohol/week: 5.0 standard drinks    Types: 5 Standard drinks or equivalent per week  . Drug use: No    Review of Systems Per HPI unless specifically indicated above     Objective:    BP 117/75   Pulse 84   Temp 97.7 F (36.5 C) (Temporal)   Resp 16   Ht 5\' 2"  (1.575 m)   Wt 158 lb (71.7 kg)   SpO2 100%   BMI 28.90 kg/m   Wt Readings from Last 3 Encounters:  07/24/20 158 lb (71.7 kg)  07/08/20 154 lb (69.9 kg)  04/24/20 155 lb 9.6 oz (70.6 kg)    Physical Exam Vitals and nursing note reviewed.    Constitutional:      General: She is not in acute distress.    Appearance: She is well-developed. She is not diaphoretic.     Comments: Well-appearing, comfortable, cooperative  HENT:     Head: Normocephalic and atraumatic.     Right Ear: Ear canal and external ear normal. There is no impacted cerumen.     Left Ear: Ear canal and external ear normal. There is no impacted cerumen.     Ears:     Comments: Bilateral ears with TM bulging full, clear effusion, no purulence, no erythema.    Mouth/Throat:     Mouth: Mucous membranes are moist.     Pharynx: No oropharyngeal exudate.     Comments: Mild generalized posterior pharyngeal changes from post nasal drainage, no focal abnormality Eyes:     General:        Right eye: No discharge.        Left eye: No discharge.     Conjunctiva/sclera: Conjunctivae normal.  Neck:     Thyroid: No thyromegaly.  Cardiovascular:     Rate and Rhythm: Normal rate and regular rhythm.     Heart sounds: Normal heart sounds. No murmur heard.   Pulmonary:     Effort: Pulmonary effort is normal. No respiratory distress.  Breath sounds: Normal breath sounds. No wheezing or rales.  Musculoskeletal:        General: Normal range of motion.     Cervical back: Normal range of motion and neck supple. No tenderness.  Lymphadenopathy:     Cervical: No cervical adenopathy.  Skin:    General: Skin is warm and dry.     Findings: No erythema or rash.  Neurological:     Mental Status: She is alert and oriented to person, place, and time.  Psychiatric:        Behavior: Behavior normal.     Comments: Well groomed, good eye contact, normal speech and thoughts    Results for orders placed or performed in visit on 07/08/20  Urine Culture   Specimen: Urine   UC  Result Value Ref Range   Urine Culture, Routine Final report    Organism ID, Bacteria Comment   HIV Antibody (routine testing w rflx)  Result Value Ref Range   HIV Screen 4th Generation wRfx Non  Reactive Non Reactive  RPR  Result Value Ref Range   RPR Ser Ql Non Reactive Non Reactive  Hepatitis C antibody  Result Value Ref Range   Hep C Virus Ab 0.1 0.0 - 0.9 s/co ratio  NuSwab Vaginitis Plus (VG+)  Result Value Ref Range   Atopobium vaginae High - 2 (A) Score   BVAB 2 Low - 0 Score   Megasphaera 1 High - 2 (A) Score   Candida albicans, NAA Positive (A) Negative   Candida glabrata, NAA Negative Negative   Trich vag by NAA Negative Negative   Chlamydia trachomatis, NAA Negative Negative   Neisseria gonorrhoeae, NAA Negative Negative  POCT Urinalysis Dipstick  Result Value Ref Range   Color, UA yellow    Clarity, UA clear    Glucose, UA Negative Negative   Bilirubin, UA n    Ketones, UA n    Spec Grav, UA     Blood, UA n    pH, UA 5.0 5.0 - 8.0   Protein, UA Negative Negative   Urobilinogen, UA negative (A) 0.2 or 1.0 E.U./dL   Nitrite, UA n    Leukocytes, UA Negative Negative   Appearance yellow    Odor none       Assessment & Plan:   Problem List Items Addressed This Visit    Eustachian tube dysfunction, bilateral    Other Visit Diagnoses    Acute non-recurrent maxillary sinusitis    -  Primary   Relevant Medications   levofloxacin (LEVAQUIN) 500 MG tablet   predniSONE (DELTASONE) 20 MG tablet   fluconazole (DIFLUCAN) 150 MG tablet   Antibiotic-induced yeast infection       Relevant Medications   fluconazole (DIFLUCAN) 150 MG tablet      Consistent with acute sinusitis, likely initially viral URI vs allergic rhinitis component with worsening concern for bacterial infection now >2 weeks with persistent focal symptoms. Additionally some eustachian tube dysfunction with allergies likely causing ear symptoms, no evidence AOM on exam. No other focal infection  Negative Home COVID19 rapid test 07/23/20. Prior COVID19 infection 02/2020 No sick contact  Plan: 1. Start taking Levaquin antibiotic 500mg  daily x 7 days (Amoxicillin allergy) - counseling on risk  of tendon injury - Add diflucan antibiotic yeast infection if develops, printed rx fill only if need 2. Continue Flonase 2 sprays in each nostril daily for next 4-6 weeks, then may stop and use seasonally or as needed 3. Add Prednisone 20mg   x 2 = 40mg  for 3 days ONLY if not improving in few days for sinus pain pressure ear pressure eustachian tubes 4. Supportive care with nasal saline OTC, hydration, add mucinex  Return criteria reviewed  #Headaches Reassuring if improved on B12, future we can re-evaluate, consider Neurology consult if indicated.   Meds ordered this encounter  Medications  . levofloxacin (LEVAQUIN) 500 MG tablet    Sig: Take 1 tablet (500 mg total) by mouth daily. For 7 days    Dispense:  7 tablet    Refill:  0  . predniSONE (DELTASONE) 20 MG tablet    Sig: Take 2 tablets (40 mg total) by mouth daily with breakfast. For up to 3 days if needed.    Dispense:  6 tablet    Refill:  0  . fluconazole (DIFLUCAN) 150 MG tablet    Sig: Take one tablet by mouth on Day 1. Repeat dose 2nd tablet on Day 3.    Dispense:  2 tablet    Refill:  0      Follow up plan: Return if symptoms worsen or fail to improve, for sinusitis.   , DO [facility] 07/24/2020, 8:17 AM

## 2020-07-24 NOTE — Patient Instructions (Addendum)
Thank you for coming to the office today.  1. It sounds like you have a Sinusitis (Bacterial Infection) - this most likely started as an Upper Respiratory Virus that has settled into an infection. Allergies can also cause this. - Start taking Levaquin antibiotic 500mg  daily x 7 days - caution muscle tendon injury if high impact activity - Resume Loratadine (Claritin) 10mg  daily and Flonase 2 sprays in each nostril daily for next 4-6 weeks, then you may stop and use seasonally or as needed - Recommend to keep using Nasal Saline spray multiple times a day to help flush out congestion and clear sinuses - Try Q-tip with antibiotic ointment neosporin or vaseline in tip of nose to help lubricate to avoid - Improve hydration by drinking plenty of clear fluids (water, gatorade) to reduce secretions and thin congestion - Congestion draining down throat can cause irritation. May try warm herbal tea with honey, cough drops - Can take Tylenol or Ibuprofen as needed for fevers - May continue over the counter cold medicine as you are, I would not use any decongestant or mucinex longer than 7 days.  ADDED Prednisone 20mg  x 2 = 40mg  with food daily for 3 days, Only take if other medicines are not working this can help relieve pressure of sinuses  If you develop persistent fever >101F for at least 3 consecutive days, headaches with sinus pain or pressure or persistent earache, please schedule a follow-up evaluation within next few days to week.  Please schedule a Follow-up Appointment to: Return if symptoms worsen or fail to improve, for sinusitis.  If you have any other questions or concerns, please feel free to call the office or send a message through MyChart. You may also schedule an earlier appointment if necessary.  Additionally, you may be receiving a survey about your experience at our office within a few days to 1 week by e-mail or mail. We value your feedback.  , DO [facility], 

## 2020-07-29 DIAGNOSIS — R42 Dizziness and giddiness: Secondary | ICD-10-CM

## 2020-07-29 DIAGNOSIS — R002 Palpitations: Secondary | ICD-10-CM

## 2020-07-29 DIAGNOSIS — R0789 Other chest pain: Secondary | ICD-10-CM

## 2020-07-29 DIAGNOSIS — R55 Syncope and collapse: Secondary | ICD-10-CM

## 2020-08-14 NOTE — Addendum Note (Signed)
Addended by: Smitty Cords on: 08/14/2020 06:52 PM   Modules accepted: Orders

## 2020-09-04 ENCOUNTER — Ambulatory Visit: Payer: 59 | Admitting: Cardiology

## 2020-09-04 ENCOUNTER — Other Ambulatory Visit: Payer: Self-pay

## 2020-09-04 ENCOUNTER — Ambulatory Visit (INDEPENDENT_AMBULATORY_CARE_PROVIDER_SITE_OTHER): Payer: 59

## 2020-09-04 ENCOUNTER — Encounter: Payer: Self-pay | Admitting: Cardiology

## 2020-09-04 VITALS — BP 114/79 | HR 78 | Ht 62.0 in | Wt 159.0 lb

## 2020-09-04 DIAGNOSIS — F419 Anxiety disorder, unspecified: Secondary | ICD-10-CM | POA: Diagnosis not present

## 2020-09-04 DIAGNOSIS — R55 Syncope and collapse: Secondary | ICD-10-CM | POA: Diagnosis not present

## 2020-09-04 NOTE — Progress Notes (Signed)
Electrophysiology Office Note:    Date:  09/04/2020   ID:  Felicia Lambert, DOB 18-Jun-1991, MRN [id number]  [place]  [id number] [facility] Cardiologist:  No primary care provider on file.  [facility] Electrophysiologist:  Lanier Prude, MD   Referring MD: Saralyn Pilar *   Chief Complaint: Vasovagal syncope, chest pain  History of Present Illness:    Felicia Lambert is a 29 y.o. female who presents for an evaluation of vasovagal syncope and atypical chest pain at the request of Dr. Althea Charon. Their medical history includes COVID-19 infection in April 2021.  She was seen 04/24/2020 by Dr. Althea Charon and reported an episode of near syncope.  She was seated during the episode and she felt warm and saw stars.  After a minute of the symptoms they resolved and she felt somewhat shaky afterwards.  At that visit she reported a history of fainting as a child.  Today in clinic she tells me she had another episode 2 weeks ago while she was seated during work. She said she feels a warmth, over her whole body and she has some visual disturbances. She never completely loses consciousness but after the worst of the symptoms she does notice that her heart rate is a little faster and stronger than usual. She does her best to stay hydrated drinking water throughout the day. Does not notice that prolonged standing reliably reproduces the symptoms. She does tell me that as a child changing from supine to standing caused some of her lightheadedness..  Past Medical History:  Diagnosis Date  . COVID-19    02/2020    Past Surgical History:  Procedure Laterality Date  . TONSILLECTOMY  2014  . WISDOM TOOTH EXTRACTION  2011    Current Medications: Current Meds  Medication Sig  . fluconazole (DIFLUCAN) 150 MG tablet Take one tablet by mouth on Day 1. Repeat dose 2nd tablet on Day 3.  . fluticasone (FLONASE) 50 MCG/ACT nasal spray USE 2 SPRAYS INTO BOTH NOSTRILS EVERY DAY; USE FOR  4- 6 WEEKS THEN STOP AND USE SEASONALLY OR AS NEEDED  . Norethindrone Acetate-Ethinyl Estrad-FE (LOESTRIN 24 FE) 1-20 MG-MCG(24) tablet Take 1 tablet by mouth daily.  . vitamin B-12 (CYANOCOBALAMIN) 1000 MCG tablet Take 1,000 mcg by mouth daily.     Allergies:   Peanut-containing drug products, Amoxicillin, Apple, Avocado, Cefdinir, Seasonal ic [cholestatin], and Watermelon flavor   Social History   Socioeconomic History  . Marital status: Single    Spouse name: Not on file  . Number of children: Not on file  . Years of education: College  . Highest education level: Bachelor's degree (e.g., BA, AB, BS)  Occupational History  . Not on file  Tobacco Use  . Smoking status: Never Smoker  . Smokeless tobacco: Never Used  Vaping Use  . Vaping Use: Never used  Substance and Sexual Activity  . Alcohol use: Yes    Alcohol/week: 5.0 standard drinks    Types: 5 Standard drinks or equivalent per week  . Drug use: No  . Sexual activity: Yes    Partners: Male    Birth control/protection: OCP  Other Topics Concern  . Not on file  Social History Narrative  . Not on file   Social Determinants of Health   Financial Resource Strain:   . Difficulty of Paying Living Expenses: Not on file  Food Insecurity:   . Worried About Programme researcher, broadcasting/film/video in the Last Year: Not on file  .  Ran Out of Food in the Last Year: Not on file  Transportation Needs:   . Lack of Transportation (Medical): Not on file  . Lack of Transportation (Non-Medical): Not on file  Physical Activity:   . Days of Exercise per Week: Not on file  . Minutes of Exercise per Session: Not on file  Stress:   . Feeling of Stress : Not on file  Social Connections:   . Frequency of Communication with Friends and Family: Not on file  . Frequency of Social Gatherings with Friends and Family: Not on file  . Attends Religious Services: Not on file  . Active Member of Clubs or Organizations: Not on file  . Attends Banker  Meetings: Not on file  . Marital Status: Not on file     Family History: The patient's family history includes Deep vein thrombosis in her father; Pulmonary embolism in her father; Stroke in her father; Thyroid disease in her father.  ROS:   Please see the history of present illness.    All other systems reviewed and are negative.  EKGs/Labs/Other Studies Reviewed:    The following studies were reviewed today: Prior records  Orthostatics Laying down 114/87, 78 Sitting 121/83, 78 Standing 120/80, 80 3-minute 118/81, 88  EKG:  The ekg ordered today demonstrates normal sinus rhythm. No preexcitation. QTC 417. Recent Labs: 03/19/2020: ALT 14; BUN 10; Creat 0.80; Hemoglobin 12.7; Platelets 301; Potassium 4.2; Sodium 137; TSH 1.11  Recent Lipid Panel No results found for: CHOL, TRIG, HDL, CHOLHDL, VLDL, LDLCALC, LDLDIRECT  Physical Exam:    VS:  BP 114/79   Pulse 78   Ht 5\' 2"  (1.575 m)   Wt 159 lb (72.1 kg)   SpO2 100%   BMI 29.08 kg/m     Wt Readings from Last 3 Encounters:  09/04/20 159 lb (72.1 kg)  07/24/20 158 lb (71.7 kg)  07/08/20 154 lb (69.9 kg)     GEN:  Well nourished, well developed in no acute distress HEENT: Normal NECK: No JVD; No carotid bruits LYMPHATICS: No lymphadenopathy CARDIAC: RRR, no murmurs, rubs, gallops RESPIRATORY:  Clear to auscultation without rales, wheezing or rhonchi  ABDOMEN: Soft, non-tender, non-distended MUSCULOSKELETAL:  No edema; No deformity  SKIN: Warm and dry NEUROLOGIC:  Alert and oriented x 3 PSYCHIATRIC:  Normal affect   ASSESSMENT:    1. Near syncope   2. Anxiety    PLAN:    In order of problems listed above:  1. Near syncope Her history and physical exam are consistent with vasovagal syncope. As a child it seems like there was an orthostatic component but that does not seem to be as prominent now. I recommended that she continue to stay hydrated. She should avoid abrupt changes in position. I recommended that  she wear a 14-day monitor to exclude any arrhythmias as the primary cause of her symptoms although I do not think this is likely. Continue routine aerobic exercise.  2. Anxiety Patient does tell me that she has been feeling more anxious lately. Her father was concerned that this was a primary cause of the symptoms. I have encouraged her to seek the opinion of Dr. 07/10/20.  Medication Adjustments/Labs and Tests Ordered: Current medicines are reviewed at length with the patient today.  Concerns regarding medicines are outlined above.  Orders Placed This Encounter  Procedures  . LONG TERM MONITOR (3-14 DAYS)  . EKG 12-Lead   No orders of the defined types were placed in this encounter.  Signed, Steffanie Dunn, MD, [facility]  09/04/2020 9:49 AM    Electrophysiology [facility]

## 2020-09-04 NOTE — Patient Instructions (Addendum)
Medication Instructions:  Your physician recommends that you continue on your current medications as directed. Please refer to the Current Medication list given to you today. *If you need a refill on your cardiac medications before your next appointment, please call your pharmacy*  Lab Work: None ordered. If you have labs (blood work) drawn today and your tests are completely normal, you will receive your results only by:  MyChart Message (if you have MyChart) OR  A paper copy in the mail If you have any lab test that is abnormal or we need to change your treatment, we will call you to review the results.  Testing/Procedures: Your physician has recommended that you wear a holter monitor. Holter monitors are medical devices that record the hearts electrical activity. Doctors most often use these monitors to diagnose arrhythmias. Arrhythmias are problems with the speed or rhythm of the heartbeat. The monitor is a small, portable device. You can wear one while you do your normal daily activities. This is usually used to diagnose what is causing palpitations/syncope (passing out).  You will wear a zio monitor for 14 days  Follow-Up: At [facility], you and your health needs are our priority.  As part of our continuing mission to provide you with exceptional heart care, we have created designated Provider Care Teams.  These Care Teams include your primary Cardiologist (physician) and Advanced Practice Providers (APPs -  Physician Assistants and Nurse Practitioners) who all work together to provide you with the care you need, when you need it.  Your next appointment:   Your physician wants you to follow-up in: based on results of heart monitor  ZIO XT- Long Term Monitor Instructions   Your physician has requested you wear your ZIO patch monitor__14_days.   This is a single patch monitor.  [employer] supplies one patch monitor per enrollment.  Additional stickers are not available.   Please  do not apply patch if you will be having a Nuclear Stress Test, Echocardiogram, Cardiac CT, MRI, or Chest Xray during the time frame you would be wearing the monitor. The patch cannot be worn during these tests.  You cannot remove and re-apply the ZIO XT patch monitor.   Your ZIO patch monitor will be sent USPS Priority mail from [facility] directly to your home address. The monitor may also be mailed to a PO BOX if home delivery is not available.   It may take 3-5 days to receive your monitor after you have been enrolled.   Once you have received you monitor, please review enclosed instructions.  Your monitor has already been registered assigning a specific monitor serial # to you.   Applying the monitor   Shave hair from upper left chest.   Hold abrader disc by orange tab.  Rub abrader in 40 strokes over left upper chest as indicated in your monitor instructions.   Clean area with 4 enclosed alcohol pads .  Use all pads to assure are is cleaned thoroughly.  Let dry.   Apply patch as indicated in monitor instructions.  Patch will be place under collarbone on left side of chest with arrow pointing upward.   Rub patch adhesive wings for 2 minutes.Remove white label marked "1".  Remove white label marked "2".  Rub patch adhesive wings for 2 additional minutes.   While looking in a mirror, press and release button in center of patch.  A small green light will flash 3-4 times .  This will be your only indicator the  monitor has been turned on.     Do not shower for the first 24 hours.  You may shower after the first 24 hours.   Press button if you feel a symptom. You will hear a small click.  Record Date, Time and Symptom in the Patient Log Book.   When you are ready to remove patch, follow instructions on last 2 pages of Patient Log Book.  Stick patch monitor onto last page of Patient Log Book.   Place Patient Log Book in [place] box.  Use locking tab on box and tape box closed securely.   The [employer] and [employer] has [employer] on it.  Please place in mailbox as soon as possible.  Your physician should have your test results approximately 7 days after the monitor has been mailed back to [facility].   Call [facility] Customer Care at [phone] if you have questions regarding your ZIO XT patch monitor.  Call them immediately if you see an orange light blinking on your monitor.   If your monitor falls off in less than 4 days contact our Monitor department at [phone].  If your monitor becomes loose or falls off after 4 days call [employer] at [phone] for suggestions on securing your monitor.

## 2020-09-24 ENCOUNTER — Encounter: Payer: Self-pay | Admitting: Obstetrics & Gynecology

## 2020-09-24 DIAGNOSIS — J301 Allergic rhinitis due to pollen: Secondary | ICD-10-CM | POA: Diagnosis not present

## 2020-09-24 DIAGNOSIS — T781XXD Other adverse food reactions, not elsewhere classified, subsequent encounter: Secondary | ICD-10-CM | POA: Diagnosis not present

## 2020-09-24 DIAGNOSIS — J3089 Other allergic rhinitis: Secondary | ICD-10-CM | POA: Diagnosis not present

## 2020-09-24 DIAGNOSIS — Z91018 Allergy to other foods: Secondary | ICD-10-CM | POA: Diagnosis not present

## 2020-09-25 DIAGNOSIS — R55 Syncope and collapse: Secondary | ICD-10-CM | POA: Diagnosis not present

## 2020-10-01 DIAGNOSIS — J301 Allergic rhinitis due to pollen: Secondary | ICD-10-CM | POA: Diagnosis not present

## 2020-10-01 DIAGNOSIS — J3081 Allergic rhinitis due to animal (cat) (dog) hair and dander: Secondary | ICD-10-CM | POA: Diagnosis not present

## 2020-10-02 DIAGNOSIS — J3089 Other allergic rhinitis: Secondary | ICD-10-CM | POA: Diagnosis not present

## 2020-10-03 DIAGNOSIS — J3089 Other allergic rhinitis: Secondary | ICD-10-CM | POA: Diagnosis not present

## 2020-10-03 DIAGNOSIS — J301 Allergic rhinitis due to pollen: Secondary | ICD-10-CM | POA: Diagnosis not present

## 2020-10-03 DIAGNOSIS — J3081 Allergic rhinitis due to animal (cat) (dog) hair and dander: Secondary | ICD-10-CM | POA: Diagnosis not present

## 2020-10-31 DIAGNOSIS — J301 Allergic rhinitis due to pollen: Secondary | ICD-10-CM | POA: Diagnosis not present

## 2020-10-31 DIAGNOSIS — J3089 Other allergic rhinitis: Secondary | ICD-10-CM | POA: Diagnosis not present

## 2020-10-31 DIAGNOSIS — J3081 Allergic rhinitis due to animal (cat) (dog) hair and dander: Secondary | ICD-10-CM | POA: Diagnosis not present

## 2020-11-12 DIAGNOSIS — J301 Allergic rhinitis due to pollen: Secondary | ICD-10-CM | POA: Diagnosis not present

## 2020-11-12 DIAGNOSIS — J3081 Allergic rhinitis due to animal (cat) (dog) hair and dander: Secondary | ICD-10-CM | POA: Diagnosis not present

## 2020-11-12 DIAGNOSIS — J3089 Other allergic rhinitis: Secondary | ICD-10-CM | POA: Diagnosis not present

## 2020-11-19 DIAGNOSIS — J3081 Allergic rhinitis due to animal (cat) (dog) hair and dander: Secondary | ICD-10-CM | POA: Diagnosis not present

## 2020-11-19 DIAGNOSIS — J3089 Other allergic rhinitis: Secondary | ICD-10-CM | POA: Diagnosis not present

## 2020-11-19 DIAGNOSIS — J301 Allergic rhinitis due to pollen: Secondary | ICD-10-CM | POA: Diagnosis not present

## 2020-11-26 DIAGNOSIS — J3081 Allergic rhinitis due to animal (cat) (dog) hair and dander: Secondary | ICD-10-CM | POA: Diagnosis not present

## 2020-11-26 DIAGNOSIS — J3089 Other allergic rhinitis: Secondary | ICD-10-CM | POA: Diagnosis not present

## 2020-11-26 DIAGNOSIS — J301 Allergic rhinitis due to pollen: Secondary | ICD-10-CM | POA: Diagnosis not present

## 2020-12-10 DIAGNOSIS — J301 Allergic rhinitis due to pollen: Secondary | ICD-10-CM | POA: Diagnosis not present

## 2020-12-10 DIAGNOSIS — J3089 Other allergic rhinitis: Secondary | ICD-10-CM | POA: Diagnosis not present

## 2020-12-10 DIAGNOSIS — J3081 Allergic rhinitis due to animal (cat) (dog) hair and dander: Secondary | ICD-10-CM | POA: Diagnosis not present

## 2020-12-17 ENCOUNTER — Telehealth: Payer: Self-pay | Admitting: *Deleted

## 2020-12-17 DIAGNOSIS — J3089 Other allergic rhinitis: Secondary | ICD-10-CM | POA: Diagnosis not present

## 2020-12-17 DIAGNOSIS — J301 Allergic rhinitis due to pollen: Secondary | ICD-10-CM | POA: Diagnosis not present

## 2020-12-17 DIAGNOSIS — J3081 Allergic rhinitis due to animal (cat) (dog) hair and dander: Secondary | ICD-10-CM | POA: Diagnosis not present

## 2020-12-17 DIAGNOSIS — Z30011 Encounter for initial prescription of contraceptive pills: Secondary | ICD-10-CM

## 2020-12-17 MED ORDER — NORETHIN ACE-ETH ESTRAD-FE 1-20 MG-MCG(24) PO TABS: 1.0000 | ORAL_TABLET | Freq: Every day | ORAL | 0 refills | Status: DC

## 2020-12-17 NOTE — Telephone Encounter (Signed)
Patient called requesting refill on birth control Loestrin 74 Fe, annual exam scheduled on 01/10/21. Rx sent.

## 2020-12-24 DIAGNOSIS — J301 Allergic rhinitis due to pollen: Secondary | ICD-10-CM | POA: Diagnosis not present

## 2020-12-24 DIAGNOSIS — J3089 Other allergic rhinitis: Secondary | ICD-10-CM | POA: Diagnosis not present

## 2020-12-24 DIAGNOSIS — J3081 Allergic rhinitis due to animal (cat) (dog) hair and dander: Secondary | ICD-10-CM | POA: Diagnosis not present

## 2020-12-31 DIAGNOSIS — J3081 Allergic rhinitis due to animal (cat) (dog) hair and dander: Secondary | ICD-10-CM | POA: Diagnosis not present

## 2020-12-31 DIAGNOSIS — J301 Allergic rhinitis due to pollen: Secondary | ICD-10-CM | POA: Diagnosis not present

## 2020-12-31 DIAGNOSIS — J3089 Other allergic rhinitis: Secondary | ICD-10-CM | POA: Diagnosis not present

## 2021-01-07 DIAGNOSIS — J3081 Allergic rhinitis due to animal (cat) (dog) hair and dander: Secondary | ICD-10-CM | POA: Diagnosis not present

## 2021-01-07 DIAGNOSIS — J3089 Other allergic rhinitis: Secondary | ICD-10-CM | POA: Diagnosis not present

## 2021-01-07 DIAGNOSIS — J301 Allergic rhinitis due to pollen: Secondary | ICD-10-CM | POA: Diagnosis not present

## 2021-01-07 NOTE — Progress Notes (Signed)
30 y.o. Felicia Lambert or [place] Native female here for annual exam.     Father recently was diagnosed with factor V deficiency, had some blood clots, would like to be tested  Reports started dizziness x more than a year. Gets foggy. The first time it happened lost peripheral vision. Gets a lot of sinus infections and maybe related to that. She sees an allergist but doesn't seem to be happening. Out of the blue will get dizzy, random. Occurs increasingly since last fall (over the last 4 months). Does not faint with these episodes. Dizziness can last a minute or several hours. Has about 5-10 episodes per month.   For now would like to continue birth control pills  Patient's last menstrual period was 12/28/2020 (exact date).          Sexually active: Yes.    The current method of family planning is OCP (estrogen/progesterone).    Exercising: Yes.    Bike, push-ups, sit-ups Smoker:  no  Health Maintenance: Pap: 11-26-16 ASCUS, 12-09-2017 neg, 12-20-2018 neg History of abnormal Pap:  yes MMG:  none Colonoscopy:  none BMD:   none TDaP:  2013 Gardasil:   unsure Covid-19: pfizer had 2 done Hep C testing: neg 2021 Screening Labs: wants some testing today   reports that she has never smoked. She has never used smokeless tobacco. She reports current alcohol use of about 5.0 standard drinks of alcohol per week. She reports that she does not use drugs.  Past Medical History:  Diagnosis Date  . COVID-19    02/2020    Past Surgical History:  Procedure Laterality Date  . TONSILLECTOMY  2014  . WISDOM TOOTH EXTRACTION  2011    Current Outpatient Medications  Medication Sig Dispense Refill  . Norethindrone Acetate-Ethinyl Estrad-FE (LOESTRIN 24 FE) 1-20 MG-MCG(24) tablet Take 1 tablet by mouth daily. 28 tablet 0  . SALINE NASAL SPRAY NA     . vitamin B-12 (CYANOCOBALAMIN) 1000 MCG tablet Take 1,000 mcg by mouth daily.     No current facility-administered medications for this  visit.    Family History  Problem Relation Age of Onset  . Stroke Father   . Thyroid disease Father   . Pulmonary embolism Father        thought related to Covid diagnosis  . Deep vein thrombosis Father   . Factor V Leiden deficiency Father        prothrombin gene mutation    Review of Systems  Constitutional:       Dizziness occ  HENT: Negative.   Eyes: Negative.   Respiratory: Negative.   Cardiovascular: Negative.   Gastrointestinal: Negative.   Endocrine: Negative.   Genitourinary: Negative.   Musculoskeletal: Negative.   Skin: Negative.   Allergic/Immunologic: Negative.   Neurological: Negative.   Hematological: Negative.   Psychiatric/Behavioral: Negative.     Exam:   BP 102/64   Pulse 70   Resp 16   Ht 5' 2.75" (1.594 m)   Wt 164 lb (74.4 kg)   LMP 12/28/2020 (Exact Date)   BMI 29.28 kg/m   Height: 5' 2.75" (159.4 cm)  General appearance: alert, cooperative and appears stated age, no acute distress Head: Normocephalic, without obvious abnormality Neck: no adenopathy, thyroid normal to inspection and palpation Lungs: clear to auscultation bilaterally Breasts: No axillary or supraclavicular adenopathy, Normal to palpation without dominant masses Heart: regular rate and rhythm Abdomen: soft, non-tender; no masses,  no organomegaly Extremities: extremities normal, no edema Skin: No  rashes or lesions Lymph nodes: Cervical, supraclavicular, and axillary nodes normal. No abnormal inguinal nodes palpated Neurologic: Grossly normal   Pelvic: External genitalia:  no lesions              Urethra:  normal appearing urethra with no masses, tenderness or lesions              Bartholins and Skenes: normal                 Vagina: normal appearing vagina, appropriate for age, normal appearing discharge, no lesions              Cervix: neg cervical motion tenderness, no visible lesions             Bimanual Exam:   Uterus:  normal size, contour, position,  consistency, mobility, non-tender              Adnexa: no mass, fullness, tenderness                 Joy, CMA Chaperone was present for exam.  A/P:  Well woman exam with routine gynecological exam  Screening examination for STD (sexually transmitted disease) - Plan: SURESWAB CT/NG/T. vaginalis  Family history of factor V Leiden mutation - Plan: Factor 5 leiden  Dizzinesses - Plan: Thyroid Panel With TSH, Comp Met (CMET), Hemoglobin A1c  Encounter for surveillance of contraceptive pills - Plan: Norethindrone Acetate-Ethinyl Estrad-FE (LOESTRIN 24 FE) 1-20 MG-MCG(24) tablet  Pap : pap smear due 2023  If lab work is normal, will recommend neurology referral for dizziness evaluation. Encouraged hydration, good sleep, compression socks and exercise.  If factor 5 leiden test abnormal, advised patient that she should use a different contraceptive. Discussed IUD and Nexplanon

## 2021-01-10 ENCOUNTER — Other Ambulatory Visit: Payer: Self-pay

## 2021-01-10 ENCOUNTER — Encounter: Payer: Self-pay | Admitting: Nurse Practitioner

## 2021-01-10 ENCOUNTER — Ambulatory Visit: Payer: BC Managed Care – PPO | Admitting: Nurse Practitioner

## 2021-01-10 VITALS — BP 102/64 | HR 70 | Resp 16 | Ht 62.75 in | Wt 164.0 lb

## 2021-01-10 DIAGNOSIS — R42 Dizziness and giddiness: Secondary | ICD-10-CM

## 2021-01-10 DIAGNOSIS — Z113 Encounter for screening for infections with a predominantly sexual mode of transmission: Secondary | ICD-10-CM | POA: Diagnosis not present

## 2021-01-10 DIAGNOSIS — Z3041 Encounter for surveillance of contraceptive pills: Secondary | ICD-10-CM

## 2021-01-10 DIAGNOSIS — Z01419 Encounter for gynecological examination (general) (routine) without abnormal findings: Secondary | ICD-10-CM | POA: Diagnosis not present

## 2021-01-10 DIAGNOSIS — Z832 Family history of diseases of the blood and blood-forming organs and certain disorders involving the immune mechanism: Secondary | ICD-10-CM

## 2021-01-10 MED ORDER — NORETHIN ACE-ETH ESTRAD-FE 1-20 MG-MCG(24) PO TABS: 1.0000 | ORAL_TABLET | Freq: Every day | ORAL | 4 refills | Status: DC

## 2021-01-10 NOTE — Patient Instructions (Signed)
Health Maintenance, Female Adopting a healthy lifestyle and getting preventive care are important in promoting health and wellness. Ask your health care provider about:  The right schedule for you to have regular tests and exams.  Things you can do on your own to prevent diseases and keep yourself healthy. What should I know about diet, weight, and exercise? Eat a healthy diet  Eat a diet that includes plenty of vegetables, fruits, low-fat dairy products, and lean protein.  Do not eat a lot of foods that are high in solid fats, added sugars, or sodium.   Maintain a healthy weight Body mass index (BMI) is used to identify weight problems. It estimates body fat based on height and weight. Your health care provider can help determine your BMI and help you achieve or maintain a healthy weight. Get regular exercise Get regular exercise. This is one of the most important things you can do for your health. Most adults should:  Exercise for at least 150 minutes each week. The exercise should increase your heart rate and make you sweat (moderate-intensity exercise).  Do strengthening exercises at least twice a week. This is in addition to the moderate-intensity exercise.  Spend less time sitting. Even light physical activity can be beneficial. Watch cholesterol and blood lipids Have your blood tested for lipids and cholesterol at 28 years of age, then have this test every 5 years. Have your cholesterol levels checked more often if:  Your lipid or cholesterol levels are high.  You are older than 28 years of age.  You are at high risk for heart disease. What should I know about cancer screening? Depending on your health history and family history, you may need to have cancer screening at various ages. This may include screening for:  Breast cancer.  Cervical cancer.  Colorectal cancer.  Skin cancer.  Lung cancer. What should I know about heart disease, diabetes, and high blood  pressure? Blood pressure and heart disease  High blood pressure causes heart disease and increases the risk of stroke. This is more likely to develop in people who have high blood pressure readings, are of African descent, or are overweight.  Have your blood pressure checked: ? Every 3-5 years if you are 18-39 years of age. ? Every year if you are 40 years old or older. Diabetes Have regular diabetes screenings. This checks your fasting blood sugar level. Have the screening done:  Once every three years after age 40 if you are at a normal weight and have a low risk for diabetes.  More often and at a younger age if you are overweight or have a high risk for diabetes. What should I know about preventing infection? Hepatitis B If you have a higher risk for hepatitis B, you should be screened for this virus. Talk with your health care provider to find out if you are at risk for hepatitis B infection. Hepatitis C Testing is recommended for:  Everyone born from 1945 through 1965.  Anyone with known risk factors for hepatitis C. Sexually transmitted infections (STIs)  Get screened for STIs, including gonorrhea and chlamydia, if: ? You are sexually active and are younger than 28 years of age. ? You are older than 28 years of age and your health care provider tells you that you are at risk for this type of infection. ? Your sexual activity has changed since you were last screened, and you are at increased risk for chlamydia or gonorrhea. Ask your health care provider   if you are at risk.  Ask your health care provider about whether you are at high risk for HIV. Your health care provider may recommend a prescription medicine to help prevent HIV infection. If you choose to take medicine to prevent HIV, you should first get tested for HIV. You should then be tested every 3 months for as long as you are taking the medicine. Pregnancy  If you are about to stop having your period (premenopausal) and  you may become pregnant, seek counseling before you get pregnant.  Take 400 to 800 micrograms (mcg) of folic acid every day if you become pregnant.  Ask for birth control (contraception) if you want to prevent pregnancy. Osteoporosis and menopause Osteoporosis is a disease in which the bones lose minerals and strength with aging. This can result in bone fractures. If you are 65 years old or older, or if you are at risk for osteoporosis and fractures, ask your health care provider if you should:  Be screened for bone loss.  Take a calcium or vitamin D supplement to lower your risk of fractures.  Be given hormone replacement therapy (HRT) to treat symptoms of menopause. Follow these instructions at home: Lifestyle  Do not use any products that contain nicotine or tobacco, such as cigarettes, e-cigarettes, and chewing tobacco. If you need help quitting, ask your health care provider.  Do not use street drugs.  Do not share needles.  Ask your health care provider for help if you need support or information about quitting drugs. Alcohol use  Do not drink alcohol if: ? Your health care provider tells you not to drink. ? You are pregnant, may be pregnant, or are planning to become pregnant.  If you drink alcohol: ? Limit how much you use to 0-1 drink a day. ? Limit intake if you are breastfeeding.  Be aware of how much alcohol is in your drink. In the [place] one drink equals one 12 oz bottle of beer (355 mL), one 5 oz glass of wine (148 mL), or one 1 oz glass of hard liquor (44 mL). General instructions  Schedule regular health, dental, and eye exams.  Stay current with your vaccines.  Tell your health care provider if: ? You often feel depressed. ? You have ever been abused or do not feel safe at home. Summary  Adopting a healthy lifestyle and getting preventive care are important in promoting health and wellness.  Follow your health care provider's instructions about healthy  diet, exercising, and getting tested or screened for diseases.  Follow your health care provider's instructions on monitoring your cholesterol and blood pressure. This information is not intended to replace advice given to you by your health care provider. Make sure you discuss any questions you have with your health care provider. Document Revised: 10/26/2018 Document Reviewed: 10/26/2018 Elsevier Patient Education  2021 Elsevier Inc.  

## 2021-01-13 LAB — SURESWAB CT/NG/T. VAGINALIS
C. trachomatis RNA, TMA: NOT DETECTED
N. gonorrhoeae RNA, TMA: NOT DETECTED
Trichomonas vaginalis RNA: NOT DETECTED

## 2021-01-14 DIAGNOSIS — T781XXA Other adverse food reactions, not elsewhere classified, initial encounter: Secondary | ICD-10-CM | POA: Diagnosis not present

## 2021-01-14 DIAGNOSIS — J3089 Other allergic rhinitis: Secondary | ICD-10-CM | POA: Diagnosis not present

## 2021-01-14 DIAGNOSIS — J301 Allergic rhinitis due to pollen: Secondary | ICD-10-CM | POA: Diagnosis not present

## 2021-01-14 DIAGNOSIS — Z91018 Allergy to other foods: Secondary | ICD-10-CM | POA: Diagnosis not present

## 2021-01-14 NOTE — Progress Notes (Signed)
Please refer to neurology for dizziness if patient agreeable

## 2021-01-16 ENCOUNTER — Telehealth: Payer: Self-pay | Admitting: *Deleted

## 2021-01-16 DIAGNOSIS — R42 Dizziness and giddiness: Secondary | ICD-10-CM

## 2021-01-16 NOTE — Telephone Encounter (Signed)
-----   Message from Keenan Bachelor, [place] sent at 01/16/2021 11:06 AM EST ----- Regarding: referral to neurologist Per Clarita Crane, NP:  Please refer to neurology for dizziness   Patient agrees and knows you will be arranging referral.  Thanks

## 2021-01-16 NOTE — Telephone Encounter (Signed)
Referral placed at [facility] they will call to schedule. 

## 2021-01-17 LAB — COMPREHENSIVE METABOLIC PANEL
AG Ratio: 1.8 (calc) (ref 1.0–2.5)
ALT: 23 U/L (ref 6–29)
AST: 19 U/L (ref 10–30)
Albumin: 4.4 g/dL (ref 3.6–5.1)
Alkaline phosphatase (APISO): 39 U/L (ref 31–125)
BUN: 9 mg/dL (ref 7–25)
CO2: 23 mmol/L (ref 20–32)
Calcium: 9.7 mg/dL (ref 8.6–10.2)
Chloride: 104 mmol/L (ref 98–110)
Creat: 0.71 mg/dL (ref 0.50–1.10)
Globulin: 2.5 g/dL (calc) (ref 1.9–3.7)
Glucose, Bld: 96 mg/dL (ref 65–99)
Potassium: 4.1 mmol/L (ref 3.5–5.3)
Sodium: 138 mmol/L (ref 135–146)
Total Bilirubin: 0.5 mg/dL (ref 0.2–1.2)
Total Protein: 6.9 g/dL (ref 6.1–8.1)

## 2021-01-17 LAB — FACTOR 5 LEIDEN: Result: NEGATIVE

## 2021-01-17 LAB — THYROID PANEL WITH TSH
Free Thyroxine Index: 2.3 (ref 1.4–3.8)
T3 Uptake: 29 % (ref 22–35)
T4, Total: 7.9 ug/dL (ref 5.1–11.9)
TSH: 0.61 mIU/L

## 2021-01-17 LAB — HEMOGLOBIN A1C
Hgb A1c MFr Bld: 4.8 % of total Hgb (ref <5.7)
Mean Plasma Glucose: 91 mg/dL
eAG (mmol/L): 5 mmol/L

## 2021-01-20 NOTE — Telephone Encounter (Signed)
Patient scheduled on 02/27/21 with Anson Fret, MD

## 2021-01-22 ENCOUNTER — Other Ambulatory Visit: Payer: Self-pay

## 2021-01-22 ENCOUNTER — Encounter: Payer: Self-pay | Admitting: Nurse Practitioner

## 2021-01-22 ENCOUNTER — Ambulatory Visit: Payer: BC Managed Care – PPO | Admitting: Nurse Practitioner

## 2021-01-22 VITALS — BP 118/70

## 2021-01-22 DIAGNOSIS — B373 Candidiasis of vulva and vagina: Secondary | ICD-10-CM | POA: Diagnosis not present

## 2021-01-22 DIAGNOSIS — B3731 Acute candidiasis of vulva and vagina: Secondary | ICD-10-CM

## 2021-01-22 DIAGNOSIS — N898 Other specified noninflammatory disorders of vagina: Secondary | ICD-10-CM | POA: Diagnosis not present

## 2021-01-22 LAB — WET PREP FOR TRICH, YEAST, CLUE

## 2021-01-22 MED ORDER — FLUCONAZOLE 150 MG PO TABS: 150.0000 mg | ORAL_TABLET | ORAL | 0 refills | Status: DC

## 2021-01-22 NOTE — Progress Notes (Signed)
   Acute Office Visit  Subjective:    Patient ID: Felicia Lambert, female    DOB: Aug 16, 1991, 30 y.o.   MRN: [id number]   HPI 30 y.o. presents today for vaginal itching and discharge that started a 4-5 days ago. She did use a cream she had at home last night.    Review of Systems  Constitutional: Negative.   Genitourinary: Positive for vaginal discharge.       Vaginal itching       Objective:    Physical Exam Constitutional:      Appearance: Normal appearance.  Genitourinary:    General: Normal vulva.     Vagina: Vaginal discharge present.     BP 118/70   LMP 12/28/2020 (Exact Date) Comment: PILL Wt Readings from Last 3 Encounters:  01/10/21 164 lb (74.4 kg)  09/04/20 159 lb (72.1 kg)  07/24/20 158 lb (71.7 kg)   Wet prep + yeast     Assessment & Plan:   Problem List Items Addressed This Visit   None   Visit Diagnoses    Vaginal candida    -  Primary   Relevant Medications   fluconazole (DIFLUCAN) 150 MG tablet   Vaginal itching       Relevant Orders   WET PREP FOR TRICH, YEAST, CLUE      Plan: Wet prep positive for clue cells. Diflucan 150 mg today and repeat dose in 3-5 days if symptoms persist. She is agreeable to plan.    Olivia Mackie [facility], 4:46 PM 01/22/2021

## 2021-02-27 ENCOUNTER — Ambulatory Visit: Payer: BC Managed Care – PPO | Admitting: Neurology

## 2021-03-18 ENCOUNTER — Ambulatory Visit: Payer: BC Managed Care – PPO | Admitting: Neurology

## 2021-03-28 ENCOUNTER — Ambulatory Visit: Payer: BC Managed Care – PPO | Admitting: Neurology

## 2021-04-30 IMAGING — DX DG CHEST 2V
2 series · 2 of 2 positions shown · non-contrast
Comparison: None.

CLINICAL DATA: 28-year-old female with a history COVID infection
and shortness of breath

EXAM:
CHEST - 2 VIEW

[chest pa]
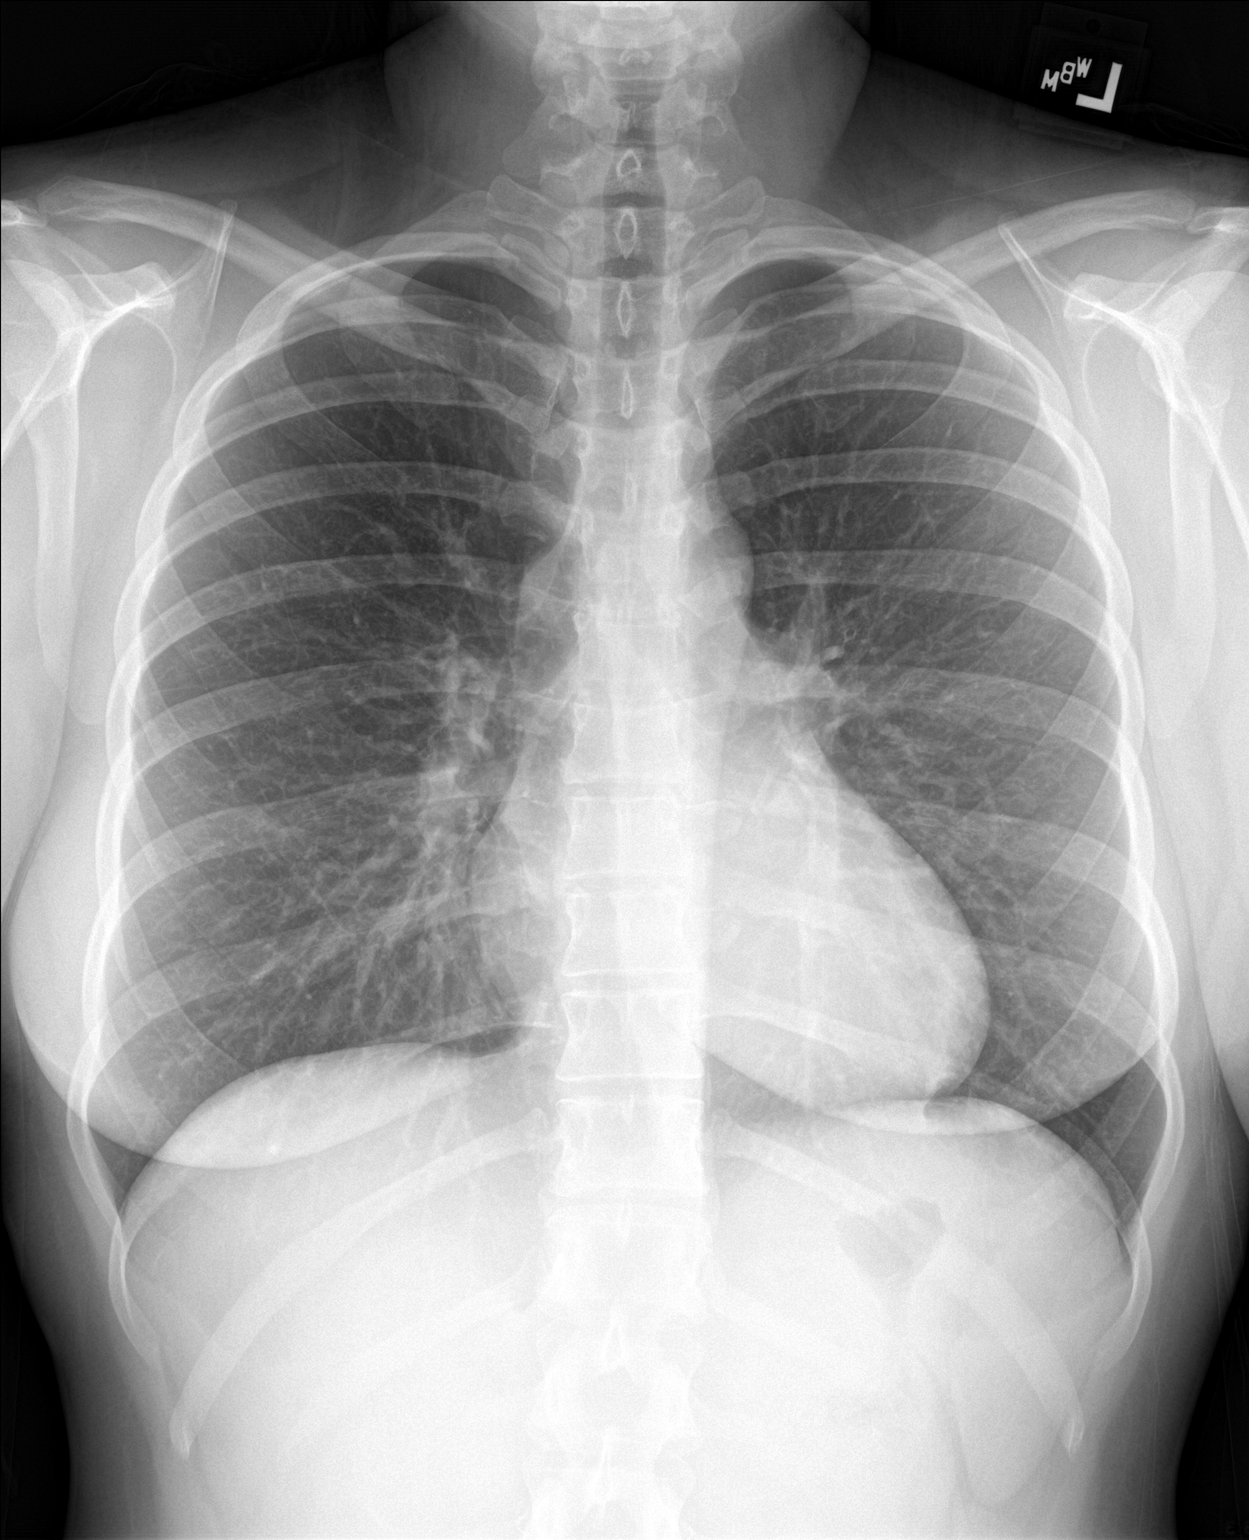

[chest lat]
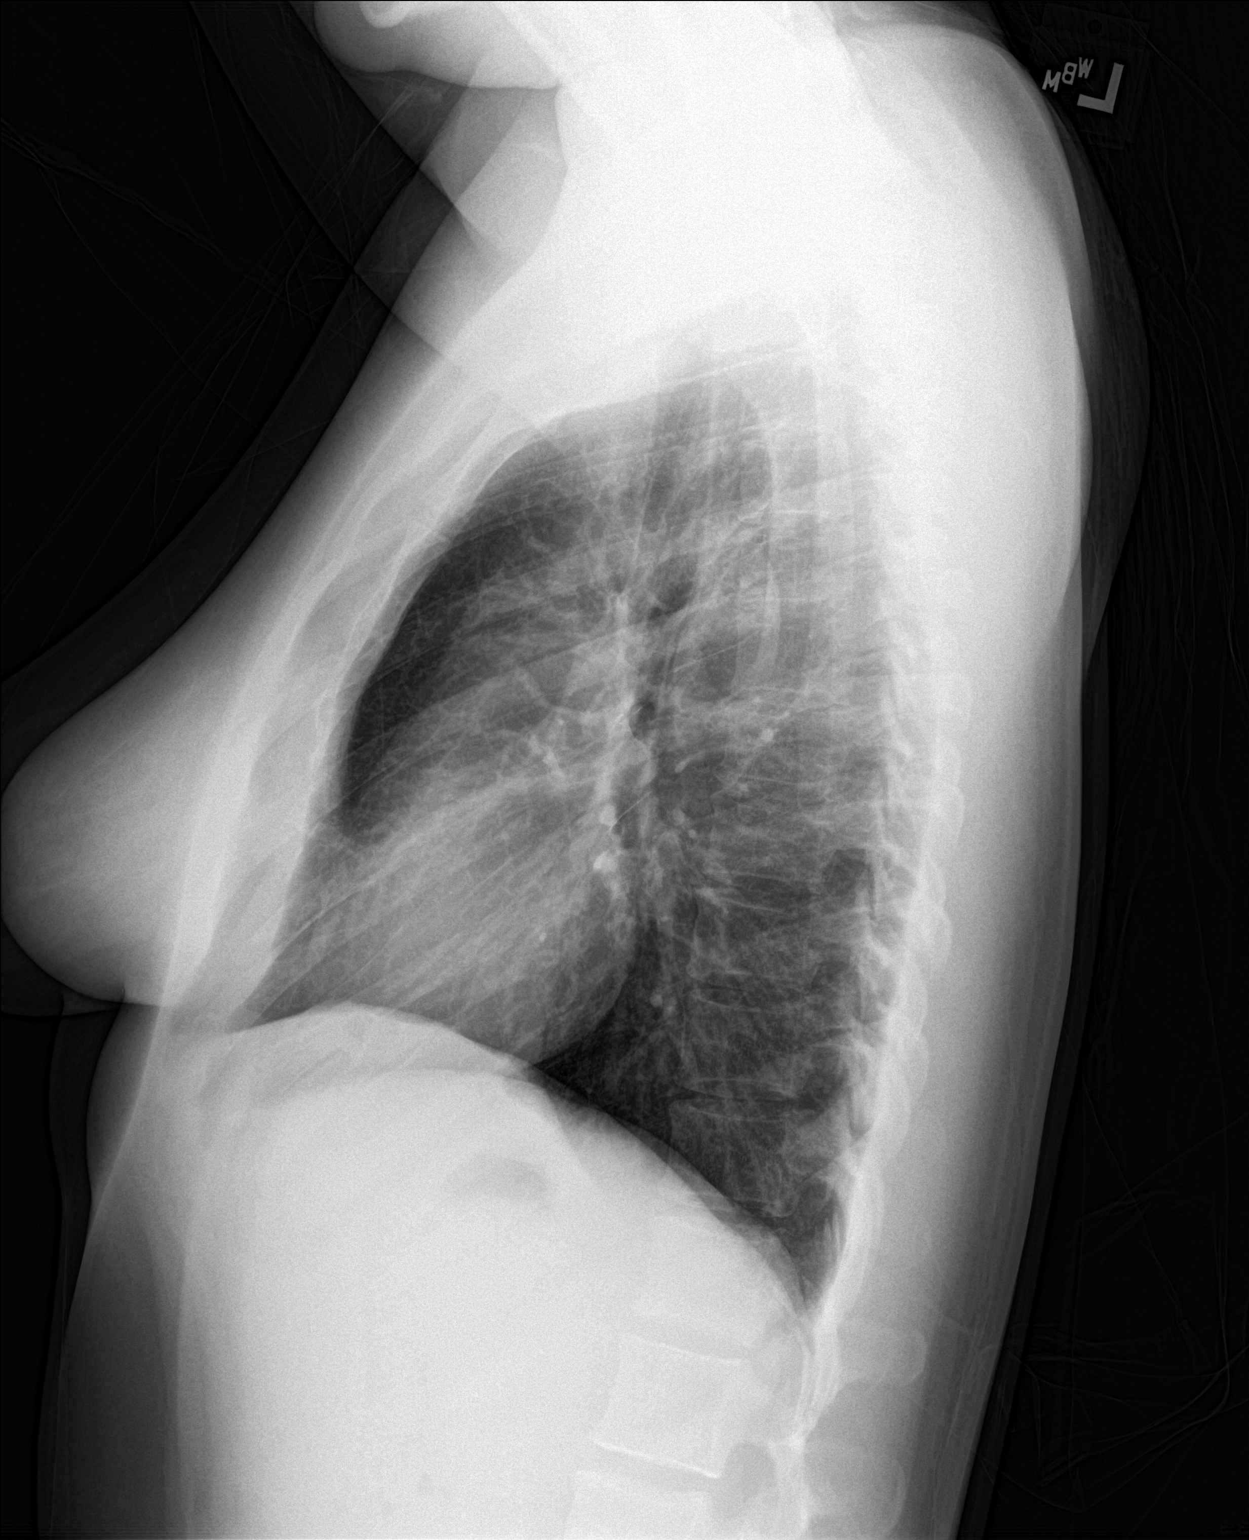

[2 of 2 positions shown; findings below may reference images not displayed]

FINDINGS: The heart size and mediastinal contours are within normal limits.
Both lungs are clear. The visualized skeletal structures are
unremarkable.
IMPRESSION: Negative for acute cardiopulmonary disease

## 2021-10-14 ENCOUNTER — Encounter: Payer: Self-pay | Admitting: Nurse Practitioner

## 2021-10-15 NOTE — Telephone Encounter (Signed)
Appointment can you reach out to patient to discuss out of pocket cost.

## 2022-01-12 ENCOUNTER — Ambulatory Visit: Payer: BC Managed Care – PPO | Admitting: Nurse Practitioner

## 2022-01-21 ENCOUNTER — Encounter: Payer: Self-pay | Admitting: Obstetrics & Gynecology

## 2022-01-21 ENCOUNTER — Ambulatory Visit (INDEPENDENT_AMBULATORY_CARE_PROVIDER_SITE_OTHER): Payer: Managed Care, Other (non HMO) | Admitting: Obstetrics & Gynecology

## 2022-01-21 ENCOUNTER — Other Ambulatory Visit: Payer: Self-pay

## 2022-01-21 ENCOUNTER — Other Ambulatory Visit (HOSPITAL_COMMUNITY)
Admission: RE | Admit: 2022-01-21 | Discharge: 2022-01-21 | Disposition: A | Discharge status: Home / Self Care | Payer: 59 | Source: Ambulatory Visit | Attending: Nurse Practitioner | Admitting: Nurse Practitioner

## 2022-01-21 VITALS — BP 110/70 | HR 70 | Resp 16 | Ht 62.25 in | Wt 173.0 lb

## 2022-01-21 DIAGNOSIS — Z91018 Allergy to other foods: Secondary | ICD-10-CM | POA: Diagnosis not present

## 2022-01-21 DIAGNOSIS — Z3041 Encounter for surveillance of contraceptive pills: Secondary | ICD-10-CM | POA: Diagnosis not present

## 2022-01-21 DIAGNOSIS — Z01419 Encounter for gynecological examination (general) (routine) without abnormal findings: Secondary | ICD-10-CM | POA: Insufficient documentation

## 2022-01-21 DIAGNOSIS — Z113 Encounter for screening for infections with a predominantly sexual mode of transmission: Secondary | ICD-10-CM

## 2022-01-21 MED ORDER — EPINEPHRINE 0.3 MG/0.3ML IJ SOAJ
0.3000 mg | INTRAMUSCULAR | 0 refills | Status: AC | PRN
Start: 2022-01-21 — End: ?

## 2022-01-21 MED ORDER — NORETHIN ACE-ETH ESTRAD-FE 1-20 MG-MCG(24) PO TABS: 1.0000 | ORAL_TABLET | Freq: Every day | ORAL | 4 refills | Status: DC

## 2022-01-21 NOTE — Progress Notes (Signed)
? ? ?Felicia Lambert 09-03-1991 [id number] ? ? ?History:    31 y.o. G1P0A1 Single.  Works in [employer] for a [employer]. ? ?RP:  Established patient presenting for annual gyn exam  ? ?HPI: Well on the generic of LoEstrin Fe 24 1/20.  Occasional BTB in the 3rd week of the pack.  No pelvic pain.  Not currently sexually active.  Last Pap Neg in 12/2018.  Breasts normal.  BMI 31.39.  Stationary bike and Yoga.   ? ?Past medical history,surgical history, family history and social history were all reviewed and documented in the EPIC chart. ? ?Gynecologic History ?No LMP recorded. ? ?Obstetric History ?OB History  ?Gravida Para Term Preterm AB Living  ?1 0 0 0 1 0  ?SAB IAB Ectopic Multiple Live Births  ?0 1 0 0    ?  ?# Outcome Date GA Lbr Len/2nd Weight Sex Delivery Anes PTL Lv  ?1 IAB           ? ? ? ?ROS: A ROS was performed and pertinent positives and negatives are included in the history. ? GENERAL: No fevers or chills. HEENT: No change in vision, no earache, sore throat or sinus congestion. NECK: No pain or stiffness. CARDIOVASCULAR: No chest pain or pressure. No palpitations. PULMONARY: No shortness of breath, cough or wheeze. GASTROINTESTINAL: No abdominal pain, nausea, vomiting or diarrhea, melena or bright red blood per rectum. GENITOURINARY: No urinary frequency, urgency, hesitancy or dysuria. MUSCULOSKELETAL: No joint or muscle pain, no back pain, no recent trauma. DERMATOLOGIC: No rash, no itching, no lesions. ENDOCRINE: No polyuria, polydipsia, no heat or cold intolerance. No recent change in weight. HEMATOLOGICAL: No anemia or easy bruising or bleeding. NEUROLOGIC: No headache, seizures, numbness, tingling or weakness. PSYCHIATRIC: No depression, no loss of interest in normal activity or change in sleep pattern.  ?  ? ?Exam: ? ? ?Ht 5' 2.25" (1.581 m)   Wt 173 lb (78.5 kg)   BMI 31.39 kg/m?  ? ?Body mass index is 31.39 kg/m?. ? ?General appearance : Well developed well nourished female. No acute  distress ?HEENT: Eyes: no retinal hemorrhage or exudates,  Neck supple, trachea midline, no carotid bruits, no thyroidmegaly ?Lungs: Clear to auscultation, no rhonchi or wheezes, or rib retractions  ?Heart: Regular rate and rhythm, no murmurs or gallops ?Breast:Examined in sitting and supine position were symmetrical in appearance, no palpable masses or tenderness,  no skin retraction, no nipple inversion, no nipple discharge, no skin discoloration, no axillary or supraclavicular lymphadenopathy ?Abdomen: no palpable masses or tenderness, no rebound or guarding ?Extremities: no edema or skin discoloration or tenderness ? ?Pelvic: Vulva: Normal ?            Vagina: No gross lesions or discharge ? Cervix: No gross lesions or discharge.  Pap reflex/Gono-Chlam. ? Uterus  AV, normal size, shape and consistency, non-tender and mobile ? Adnexa  Without masses or tenderness ? Anus: Normal ? ? ?Assessment/Plan:  31 y.o. female for annual exam  ? ?1. Encounter for routine gynecological examination with Papanicolaou smear of cervix ?Well on the generic of LoEstrin Fe 24 1/20.  Occasional BTB in the 3rd week of the pack.  No pelvic pain.  Not currently sexually active.  Last Pap Neg in 12/2018.  Breasts normal.  BMI 31.39.  Stationary bike and Yoga.   ?- Cytology - PAP( [place]) ? ?2. Encounter for surveillance of contraceptive pills ?Well on the generic of LoEstrin Fe 24 1/20.  Occasional BTB in the  3rd week of the pack.  No pelvic pain.  Not currently sexually active.  Normal gyn exam today.  Will observe BTB as it is currently improving x last 2 packs.  No CI to continue on the BCPs.  Prescription sent to pharmacy.  Will call if persists/worsens to change pill and do a Pelvic US.  Prescription sent to pharmacy. ?- Norethindrone Acetate-Ethinyl Estrad-FE (LOESTRIN 24 FE) 1-20 MG-MCG(24) tablet; Take 1 tablet by mouth daily. ? ?3. Screen for STD (sexually transmitted disease) ?Condom use recommended. ?- HIV antibody (with  reflex) ?- RPR ?- Hepatitis B Surface AntiGEN ?- Hepatitis C Antibody ?- Cytology - PAP( [place])- Gono-Chlam ? ?4. 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 ?Other orders ?- Ascorbic Acid (VITAMIN C PO); Take by mouth. ?- Omega-3 Fatty Acids (FISH OIL PO); Take by mouth. ?- VITAMIN D PO; Take by mouth. ?- loratadine (CLARITIN) 10 MG tablet; Take 10 mg by mouth daily. ?- EPINEPHrine 0.3 mg/0.3 mL IJ SOAJ injection; Inject 0.3 mg into the muscle as needed for anaphylaxis.  ? ?Genia Del MD, 8:25 AM 01/21/2022 ? ?  ?

## 2022-01-22 LAB — RPR: RPR Ser Ql: NONREACTIVE

## 2022-01-22 LAB — HEPATITIS C ANTIBODY
Hepatitis C Ab: NONREACTIVE
SIGNAL TO CUT-OFF: 0.05 (ref <1.00)

## 2022-01-22 LAB — CYTOLOGY - PAP
Chlamydia: NEGATIVE
Comment: NEGATIVE
Comment: NORMAL
Diagnosis: NEGATIVE
Neisseria Gonorrhea: NEGATIVE

## 2022-01-22 LAB — HEPATITIS B SURFACE ANTIGEN: Hepatitis B Surface Ag: NONREACTIVE

## 2022-01-22 LAB — HIV ANTIBODY (ROUTINE TESTING W REFLEX): HIV 1&2 Ab, 4th Generation: NONREACTIVE

## 2022-10-26 ENCOUNTER — Ambulatory Visit: Payer: Self-pay | Admitting: *Deleted

## 2022-10-26 NOTE — Telephone Encounter (Signed)
Summary: Congestion/cough   The patient called in stating she has been feeling bad for the last 2 weeks. She says she has had a bad cough and congestion as well as green and yellow discharge. There is a virtual appt tomorrow but she declined stating she needs to be back to work tomorrow. Please assist patient further         Called patient [phone] to review sx cough / congestion. No answer. LVMTCB [phone].

## 2022-10-26 NOTE — Telephone Encounter (Signed)
Message from Kandis Cocking sent at 10/26/2022 10:39 AM EST  Summary: Congestion/cough   The patient called in stating she has been feeling bad for the last 2 weeks. She says she has had a bad cough and congestion as well as green and yellow discharge. There is a virtual appt tomorrow but she declined stating she needs to be back to work tomorrow. Please assist patient further         Chief Complaint: cough Symptoms: green nasal secretions and green to dark yellow chest phlegm Frequency: 2 weeks ago Pertinent Negatives: Patient denies fever, SOB, chest pain. Blood in phlegm, wheezing Disposition: [] ED /[] Urgent Care (no appt availability in office) / [x] Appointment(In office/virtual)/ []  [facility]/ [] Home Care/ [] Refused Recommended Disposition /[] [facility]/ []  Follow-up with PCP Additional Notes: pt made a [facility] appt today.   Reason for Disposition  [1] Nasal discharge AND [2] present > 10 days  Answer Assessment - Initial Assessment Questions 1. ONSET: "When did the cough begin?"      2 weeks ago 2. SEVERITY: "How bad is the cough today?"      sporadic 3. SPUTUM: "Describe the color of your sputum" (none, dry cough; clear, white, yellow, green)     Green- dark yellow 4. HEMOPTYSIS: "Are you coughing up any blood?" If so ask: "How much?" (flecks, streaks, tablespoons, etc.)     N/a 5. DIFFICULTY BREATHING: "Are you having difficulty breathing?" If Yes, ask: "How bad is it?" (e.g., mild, moderate, severe)    - MILD: No SOB at rest, mild SOB with walking, speaks normally in sentences, can lie down, no retractions, pulse < 100.    - MODERATE: SOB at rest, SOB with minimal exertion and prefers to sit, cannot lie down flat, speaks in phrases, mild retractions, audible wheezing, pulse 100-120.    - SEVERE: Very SOB at rest, speaks in single words, struggling to breathe, sitting hunched forward, retractions, pulse > 120      none 6. FEVER: "Do you have a  fever?" If Yes, ask: "What is your temperature, how was it measured, and when did it start?"     no 7. CARDIAC HISTORY: "Do you have any history of heart disease?" (e.g., heart attack, congestive heart failure)      no 8. LUNG HISTORY: "Do you have any history of lung disease?"  (e.g., pulmonary embolus, asthma, emphysema)     no 9. PE RISK FACTORS: "Do you have a history of blood clots?" (or: recent major surgery, recent prolonged travel, bedridden)     N/a 10. OTHER SYMPTOMS: "Do you have any other symptoms?" (e.g., runny nose, wheezing, chest pain)       hoarse 11. PREGNANCY: "Is there any chance you are pregnant?" "When was your last menstrual period?"       N/a 12. TRAVEL: "Have you traveled out of the country in the last month?" (e.g., travel history, exposures)       N/a  Protocols used: Cough - Acute Productive-A-AH

## 2022-12-15 ENCOUNTER — Ambulatory Visit: Payer: Self-pay

## 2022-12-15 NOTE — Telephone Encounter (Signed)
Chief Complaint: Constipation Symptoms: Rectal swelling, blood on tissue x 2 over weekend, pain 7-8/10, pressure to rectum Frequency: Onset since Thursday Pertinent Negatives: Patient denies abdominal pain Disposition: [] ED /[] Urgent Care (no appt availability in office) / [x] Appointment(In office/virtual)/ []  [facility]/ [] Home Care/ [] Refused Recommended Disposition /[]  Mobile Bus/ []  Follow-up with PCP Additional Notes: N/A   Reason for Disposition  [1] Minor bleeding from rectum (e.g., blood just on toilet paper, few drops, streaks on surface of normal formed BM) AND [2] 3 or more times  Answer Assessment - Initial Assessment Questions 1. STOOL PATTERN OR FREQUENCY: "How often do you have a bowel movement (BM)?"  (Normal range: 3 times a day to every 3 days)  "When was your last BM?"       LBM Sunday (diarrhea), usually go every morning 2. STRAINING: "Do you have to strain to have a BM?"      Yes had to strain last Thursday-Saturday 3. RECTAL PAIN: "Does your rectum hurt when the stool comes out?" If Yes, ask: "Do you have hemorrhoids? How bad is the pain?"  (Scale 1-10; or mild, moderate, severe)     7-8 pain when having BM or when nothing is coming out, feeling pressure when on the toilet 4. STOOL COMPOSITION: "Are the stools hard?"      Yes Fri-Sat 5. BLOOD ON STOOLS: "Has there been any blood on the toilet tissue or on the surface of the BM?" If Yes, ask: "When was the last time?"     On the tissue blood, Saturday and Sunday 6. CHRONIC CONSTIPATION: "Is this a new problem for you?"  If No, ask: "How long have you had this problem?" (days, weeks, months)      Not a new problem, but not swollen to rectum  7. CHANGES IN DIET OR HYDRATION: "Have there been any recent changes in your diet?" "How much fluids are you drinking on a daily basis?"  "How much have you had to drink today?"     No 8. MEDICINES: "Have you been taking any new medicines?" "Are you taking  any narcotic pain medicines?" (e.g., Dilaudid, morphine, Percocet, Vicodin)     No 9. LAXATIVES: "Have you been using any stool softeners, laxatives, or enemas?"  If Yes, ask "What, how often, and when was the last time?"     Stool softener today, normally don't take stool softeners 11. OTHER SYMPTOMS: "Do you have any other symptoms?" (e.g., abdomen pain, bloating, fever, vomiting)       No 12. MEDICAL HISTORY: "Do you have a history of hemorrhoids, rectal fissures, or rectal surgery or rectal abscess?"         No 14. PREGNANCY: "Is there any chance you are pregnant?" "When was your last menstrual period?"       No  Protocols used: Constipation-A-AH

## 2022-12-16 ENCOUNTER — Ambulatory Visit: Payer: Managed Care, Other (non HMO) | Admitting: Family Medicine

## 2022-12-16 ENCOUNTER — Encounter: Payer: Self-pay | Admitting: Family Medicine

## 2022-12-16 VITALS — BP 122/86 | HR 89 | Ht 62.25 in | Wt 184.0 lb

## 2022-12-16 DIAGNOSIS — K644 Residual hemorrhoidal skin tags: Secondary | ICD-10-CM

## 2022-12-16 DIAGNOSIS — K5901 Slow transit constipation: Secondary | ICD-10-CM

## 2022-12-16 DIAGNOSIS — K602 Anal fissure, unspecified: Secondary | ICD-10-CM | POA: Diagnosis not present

## 2022-12-16 MED ORDER — DILTIAZEM GEL 2 %: 1.0000 | Freq: Three times a day (TID) | CUTANEOUS | 2 refills | Status: DC

## 2022-12-16 MED ORDER — HYDROCORT-PRAMOXINE (PERIANAL) 1-1 % EX FOAM: 1.0000 | Freq: Two times a day (BID) | CUTANEOUS | 1 refills | Status: DC | PRN

## 2022-12-16 MED ORDER — POLYETHYLENE GLYCOL 3350 17 GM/SCOOP PO POWD: 17.0000 g | Freq: Two times a day (BID) | ORAL | 1 refills | Status: DC | PRN

## 2022-12-16 NOTE — Progress Notes (Signed)
Subjective:    Patient ID: Felicia Lambert, female    DOB: 1991/04/18, 32 y.o.   MRN: [id number]  Felicia Lambert is a 32 y.o. female presenting on 12/16/2022 for Hemorrhoids and Constipation   HPI  Rectal Pain - Suspected Anal Fissure Chronic Constipation  Reports recent history of constipation, recently has had issue with constipation flare with 2 days without BM then had BM with some relief and also still blood. Past history of chronic constipation but not bothersome to her or causing pain. Admits some bright red blood on toilet paper small amt only. Tried OTC Preparation H multi symptom. Tried [employer], warm both and epsom. Tried OTC Colace 100mg  x 2 yesterday for first day and had successful BM. Worst peak of symptoms few days ago, now improving but still bothering her. Seems can flare up and burn and hurt with bowel movement. Denies fever chills diarrhea abdominal pain dark stools nausea vomiting       12/16/2022    8:45 AM 03/18/2020    1:40 PM 01/12/2020    3:25 PM  Depression screen PHQ 2/9  Decreased Interest 1 0 0  Down, Depressed, Hopeless 0 0 0  PHQ - 2 Score 1 0 0  Altered sleeping 0    Tired, decreased energy 1    Change in appetite 0    Feeling bad or failure about yourself  0    Trouble concentrating 0    Moving slowly or fidgety/restless 0    Suicidal thoughts 0    PHQ-9 Score 2    Difficult doing work/chores Not difficult at all      Social History   Tobacco Use   Smoking status: Never   Smokeless tobacco: Never  Vaping Use   Vaping Use: Never used  Substance Use Topics   Alcohol use: Yes    Alcohol/week: 5.0 standard drinks of alcohol    Types: 5 Standard drinks or equivalent per week   Drug use: No    Review of Systems Per HPI unless specifically indicated above     Objective:    BP 122/86   Pulse 89   Ht 5' 2.25" (1.581 m)   Wt 184 lb (83.5 kg)   SpO2 99%   BMI 33.38 kg/m   Wt Readings from Last 3 Encounters:  12/16/22 184 lb (83.5 kg)   01/21/22 173 lb (78.5 kg)  01/10/21 164 lb (74.4 kg)    Physical Exam Vitals and nursing note reviewed.  Constitutional:      General: She is not in acute distress.    Appearance: Normal appearance. She is well-developed. She is not diaphoretic.     Comments: Well-appearing, comfortable, cooperative  HENT:     Head: Normocephalic and atraumatic.  Eyes:     General:        Right eye: No discharge.        Left eye: No discharge.     Conjunctiva/sclera: Conjunctivae normal.  Cardiovascular:     Rate and Rhythm: Normal rate.  Pulmonary:     Effort: Pulmonary effort is normal.  Genitourinary:    Comments: Exam was declined today Skin:    General: Skin is warm and dry.     Findings: No erythema or rash.  Neurological:     Mental Status: She is alert and oriented to person, place, and time.  Psychiatric:        Mood and Affect: Mood normal.        Behavior: Behavior normal.  Thought Content: Thought content normal.     Comments: Well groomed, good eye contact, normal speech and thoughts    Results for orders placed or performed in visit on 01/21/22  HIV antibody (with reflex)  Result Value Ref Range   HIV 1&2 Ab, 4th Generation NON-REACTIVE NON-REACTIVE  RPR  Result Value Ref Range   RPR Ser Ql NON-REACTIVE NON-REACTIVE  Hepatitis B Surface AntiGEN  Result Value Ref Range   Hepatitis B Surface Ag NON-REACTIVE NON-REACTIVE  Hepatitis C Antibody  Result Value Ref Range   Hepatitis C Ab NON-REACTIVE NON-REACTIVE   SIGNAL TO CUT-OFF 0.05 <1.00  Cytology - PAP( [place])  Result Value Ref Range   Chlamydia Negative    Neisseria Gonorrhea Negative    Adequacy      Satisfactory for evaluation; transformation zone component PRESENT.   Diagnosis      - Negative for intraepithelial lesion or malignancy (NILM)   Microorganisms      Fungal organisms present consistent with Candida spp.   Comment Normal Reference Ranger Chlamydia - Negative    Comment      Normal  Reference Range Neisseria Gonorrhea - Negative      Assessment & Plan:   Problem List Items Addressed This Visit   None Visit Diagnoses     Anal fissure    -  Primary   Relevant Medications   polyethylene glycol powder (GLYCOLAX/MIRALAX) 17 GM/SCOOP powder   diltiazem 2 % GEL   External hemorrhoid       Relevant Medications   polyethylene glycol powder (GLYCOLAX/MIRALAX) 17 GM/SCOOP powder   hydrocortisone-pramoxine (PROCTOFOAM-HC) rectal foam   diltiazem 2 % GEL   Slow transit constipation       Relevant Medications   polyethylene glycol powder (GLYCOLAX/MIRALAX) 17 GM/SCOOP powder       Consistent with anal fissure based on history, exam was deferred today. Symptoms somewhat improved but has had recent flare up due to constipation, seems chronic constipation is contributing factor. Question if external hemorrhoid as well seems less likely but possible based on symptoms.  Limited relief with preparation H.  Plan: 1. Start Diltiazem Gel 2% 30g tube +2 refill - apply topical to anal fissure TID for 7-10 days until healed, to promote smooth muscle relaxing and blood flow for healing - Phoned rx to [facility], compounded topical 2. Rx Printed generic Proctofoam to use AS NEEDED if inflammation in this area or hemorrhoid concern can use instead of prep H 3. Continue Sitz Baths or warm bathtub soaks to help skin heal and keep area clean 4. Avoid constipation and straining, trial on Miralax for better OTC laxative clean out, can use high dose briefly or longer course low dose for maintenance. Follow-up as needed - if worsening would do re-evaluation and exam and consider refer to Gen Surg if indicated.   Meds ordered this encounter  Medications   DISCONTD: diltiazem 2 % GEL    Sig: Apply 1 Application topically 3 (three) times daily. For 7-10 days for anal fissure until healed.    Dispense:  100 g    Refill:  2   polyethylene glycol powder (GLYCOLAX/MIRALAX) 17  GM/SCOOP powder    Sig: Take 17 g by mouth 2 (two) times daily as needed.    Dispense:  225 g    Refill:  1   hydrocortisone-pramoxine (PROCTOFOAM-HC) rectal foam    Sig: Place 1 applicator rectally 2 (two) times daily as needed for hemorrhoids or anal itching. For  7-10 days or until healed.    Dispense:  10 g    Refill:  1   diltiazem 2 % GEL    Sig: Apply 1 Application topically 3 (three) times daily. For 7-10 days for anal fissure until healed.    Dispense:  30 g    Refill:  2     Follow up plan: Return if symptoms worsen or fail to improve.  Nobie Putnam, [facility] 12/16/2022, 8:51 AM

## 2022-12-16 NOTE — Patient Instructions (Addendum)
Thank you for coming to the office today.  Will call this compounding pharmacy to mix the Diltiazem 2% gel - apply 3 times a day (can do twice if you prefer) for 7-10 days until healed for ANAL FISSURE.  [facility] ? Pharmacy in [place], [place] Address: [address], [place], [place] [address] Phone: [phone]  Rx Foam - can be more effective / easier to use - for hemorrhoid / swelling / discomfort Printed, w goodrx if you can find a good deal.  Miralax is OTC  For Constipation (less frequent bowel movement that can be hard dry or involve straining).  Recommend trying OTC Miralax 17g = 1 capful in large glass water once daily for now, try several days to see if working, goal is soft stool or BM 1-2 times daily, if too loose then reduce dose or try every other day. If not effective may need to increase it to 2 doses at once in AM or may do 1 in morning and 1 in afternoon/evening  - This medicine is very safe and can be used often without any problem and will not make you dehydrated. It is good for use on AS NEEDED BASIS or even MAINTENANCE therapy for longer term for several days to weeks at a time to help regulate bowel movements  Other more natural remedies or preventative treatment: - Increase hydration with water - Increase fiber in diet (high fiber foods = vegetables, leafy greens, oats/grains) - May take OTC Fiber supplement (metamucil powder or pill/gummy) - May try OTC Probiotic    Please schedule a Follow-up Appointment to: Return if symptoms worsen or fail to improve.  If you have any other questions or concerns, please feel free to call the office or send a message through [place]. You may also schedule an earlier appointment if necessary.  Additionally, you may be receiving a survey about your experience at our office within a few days to 1 week by e-mail or mail. We value your feedback.  Nobie Putnam, DO [place]

## 2022-12-22 ENCOUNTER — Ambulatory Visit: Payer: Self-pay | Admitting: *Deleted

## 2022-12-22 ENCOUNTER — Ambulatory Visit: Payer: Managed Care, Other (non HMO) | Admitting: Family Medicine

## 2022-12-22 ENCOUNTER — Encounter: Payer: Self-pay | Admitting: Family Medicine

## 2022-12-22 VITALS — BP 124/78 | HR 81 | Ht 62.25 in | Wt 182.2 lb

## 2022-12-22 DIAGNOSIS — K644 Residual hemorrhoidal skin tags: Secondary | ICD-10-CM | POA: Diagnosis not present

## 2022-12-22 DIAGNOSIS — K648 Other hemorrhoids: Secondary | ICD-10-CM

## 2022-12-22 MED ORDER — HYDROCORTISONE ACETATE 25 MG RE SUPP: 25.0000 mg | Freq: Two times a day (BID) | RECTAL | 3 refills | Status: DC

## 2022-12-22 NOTE — Progress Notes (Signed)
Subjective:    Patient ID: Felicia Lambert, female    DOB: 06-Apr-1991, 32 y.o.   MRN: [id number]  Felicia Lambert is a 32 y.o. female presenting on 12/22/2022 for Rectal Bleeding   HPI  Rectal Bleeding Rectal Pain Hemorrhoid vs Anal Fissure Chronic Constipation  Last visit 12/16/22 for similar issue. Reviewed background info.   Reports recent history of constipation, recently has had issue with constipation flare with 2 days without BM then had BM with some relief and also still blood. Past history of chronic constipation but not bothersome to her or causing pain. Admits some bright red blood on toilet paper small amt only. Tried OTC Preparation H multi symptom. Tried [employer], warm both and epsom. Tried OTC Colace 100mg  x 2 yesterday for first day and had successful BM. Worst peak of symptoms few days ago, now improving but still bothering her. Seems can flare up and burn and hurt with bowel movement.  She was treated at that time with Diltiazem topical compounded gel for anal fissure treatment, still using preparation H, tucks wipes, and Sitz bath, miralax course to avoid constipation. Did not pick up proctofoam.  Today she returns for follow up after rectal bleeding episode, significant amount 1-2 days with bright red blood more persistent bleeding.  She reports did not have BM on Sunday Monday did have pain worsening through the day She noticed more and more blood throughout the day. Seemed to be moderate amount of blood Persistent throughout the night She had some rectal burning pain persistent. Some swelling improved Mostly red blood, not darker  She continued Diltiazem gel THREE TIMES A DAY / Preparation H THREE TIMES A DAY  Tried miralax with some good results. Had softer looser stools and not bothering. Then symptoms backed up with no BM past Sunday. Did have BM Monday with some discomfort but did not trigger any other bleeding episode.  Today it seems improved but still has  pressure, but feels like knot and swollen area has reduced.      12/16/2022    8:45 AM 03/18/2020    1:40 PM 01/12/2020    3:25 PM  Depression screen PHQ 2/9  Decreased Interest 1 0 0  Down, Depressed, Hopeless 0 0 0  PHQ - 2 Score 1 0 0  Altered sleeping 0    Tired, decreased energy 1    Change in appetite 0    Feeling bad or failure about yourself  0    Trouble concentrating 0    Moving slowly or fidgety/restless 0    Suicidal thoughts 0    PHQ-9 Score 2    Difficult doing work/chores Not difficult at all      Social History   Tobacco Use   Smoking status: Never   Smokeless tobacco: Never  Vaping Use   Vaping Use: Never used  Substance Use Topics   Alcohol use: Yes    Alcohol/week: 5.0 standard drinks of alcohol    Types: 5 Standard drinks or equivalent per week   Drug use: No    Review of Systems Per HPI unless specifically indicated above     Objective:    BP 124/78   Pulse 81   Ht 5' 2.25" (1.581 m)   Wt 182 lb 3.2 oz (82.6 kg)   SpO2 95%   BMI 33.06 kg/m   Wt Readings from Last 3 Encounters:  12/22/22 182 lb 3.2 oz (82.6 kg)  12/16/22 184 lb (83.5 kg)  01/21/22 173 lb (  78.5 kg)    Physical Exam Vitals and nursing note reviewed.  Constitutional:      General: She is not in acute distress.    Appearance: Normal appearance. She is well-developed. She is not diaphoretic.     Comments: Well-appearing, comfortable, cooperative  HENT:     Head: Normocephalic and atraumatic.  Eyes:     General:        Right eye: No discharge.        Left eye: No discharge.     Conjunctiva/sclera: Conjunctivae normal.  Cardiovascular:     Rate and Rhythm: Normal rate.  Pulmonary:     Effort: Pulmonary effort is normal.  Genitourinary:    Comments: External anal exam performed today and chaperoned by Loni Muse CMA  Visualization of anus with notable thicker columns of hemorrhoidal tissue at 6 o clock and 9 o clock that extend further into anus that appears to  have now reduced in swelling and appears may be residual from prior hemorrhoid and possibly prior anal fissure but no active fissure and no active inflamed or thrombosed external hemorrhoid.  No protruding internal hemorrhoid either. No bleeding present.  No sign of infection or abscess or erythema. Skin:    General: Skin is warm and dry.     Findings: No erythema or rash.  Neurological:     Mental Status: She is alert and oriented to person, place, and time.  Psychiatric:        Mood and Affect: Mood normal.        Behavior: Behavior normal.        Thought Content: Thought content normal.     Comments: Well groomed, good eye contact, normal speech and thoughts    Results for orders placed or performed in visit on 01/21/22  HIV antibody (with reflex)  Result Value Ref Range   HIV 1&2 Ab, 4th Generation NON-REACTIVE NON-REACTIVE  RPR  Result Value Ref Range   RPR Ser Ql NON-REACTIVE NON-REACTIVE  Hepatitis B Surface AntiGEN  Result Value Ref Range   Hepatitis B Surface Ag NON-REACTIVE NON-REACTIVE  Hepatitis C Antibody  Result Value Ref Range   Hepatitis C Ab NON-REACTIVE NON-REACTIVE   SIGNAL TO CUT-OFF 0.05 <1.00  Cytology - PAP( [place])  Result Value Ref Range   Chlamydia Negative    Neisseria Gonorrhea Negative    Adequacy      Satisfactory for evaluation; transformation zone component PRESENT.   Diagnosis      - Negative for intraepithelial lesion or malignancy (NILM)   Microorganisms      Fungal organisms present consistent with Candida spp.   Comment Normal Reference [facility] Chlamydia - Negative    Comment      Normal Reference Range Neisseria Gonorrhea - Negative      Assessment & Plan:   Problem List Items Addressed This Visit   None Visit Diagnoses     External hemorrhoid    -  Primary   Relevant Medications   hydrocortisone (ANUSOL-HC) 25 MG suppository   Internal hemorrhoid, bleeding       Relevant Medications   hydrocortisone (ANUSOL-HC) 25 MG  suppository       Subacute mixed external vs internal hemorrhoids No active anal fissure but could have been present and improved / healed now Improved on Diltiazem topical gel  Chronic constipation risk factor  6 and 9 o clock with hemorrhoid non inflamed tissue extending into anus likely some deeper internal hemorrhoidal component.  See exam, no  acute external hemorrhoid today no sign of erythema or cellulitis.  Due to extensive bleeding - seems most likely internal hemorrhoid component. Reassurance no other significant concern for that much BRBPR present today  Plan: Due to deeper involvement of hemorrhoidal tissue - will trial rx Start rx Anusol-HC hydrocortisone 25mg  suppository BID for 7 days, given +3 refill if need repeat course in future 2. Continue Preparation H topical for external symptoms 3. Continue Sitz Baths or warm bathtub soaks to help resolve flare 4. HOLD Diltiazem gel at this moment since area of fissure seems resolved or not active, may use again if returns. 5. Miralax to Avoid constipation and straining, recommend high fiber diet, improve hydration 6. May take NSAID AS NEEDED if painful flare 7. Caution with prolonged sitting  Reviewed return criteria if not improving once more we would next proceed w/ GI consult for further eval and may warrant treatment if internal hemorrhoids present that are better visualized by GI.  Work from home note   Meds ordered this encounter  Medications   hydrocortisone (ANUSOL-HC) 25 MG suppository    Sig: Place 1 suppository (25 mg total) rectally 2 (two) times daily. For 7 days    Dispense:  14 suppository    Refill:  3      Follow up plan: Return if symptoms worsen or fail to improve.   Nobie Putnam, [facility] 12/22/2022, 4:10 PM

## 2022-12-22 NOTE — Patient Instructions (Addendum)
Thank you for coming to the office today.  If anal fissure Keep on Diltiazem course as prescribed Use miralax and avoid pressure straining constipation to avoid recurrence.  If external hemorrhoid Keep using preparation H Sitz baths Miralax for bowel movements  If internal hemorrhoid - note this is hard to determine just on our exam The main treatment is to avoid the constipation and bowel movement straining This one can bleed the most but does not cause any pain. If you have larger protrusion pressure with interfering with stool it could be the internal component  We can consider GI referral in future, if persistent bleeding and believe it is internal and if the external treatments are not resolving it.  Rare can get abscess or skin infection. Usually does not occur, but possible. Caution with fevers, nausea vomiting spreading redness.   Please schedule a Follow-up Appointment to: Return if symptoms worsen or fail to improve.  If you have any other questions or concerns, please feel free to call the office or send a message through [place]. You may also schedule an earlier appointment if necessary.  Additionally, you may be receiving a survey about your experience at our office within a few days to 1 week by e-mail or mail. We value your feedback.  Nobie Putnam, DO [place]

## 2022-12-22 NOTE — Telephone Encounter (Signed)
Reason for Disposition  MILD rectal bleeding (more than just a few drops or streaks)  Answer Assessment - Initial Assessment Questions 1. APPEARANCE of BLOOD: "What color is it?" "Is it passed separately, on the surface of the stool, or mixed in with the stool?"      Rectal bleeding 2. AMOUNT: "How much blood was passed?"      Actively- light 3. FREQUENCY: "How many times has blood been passed with the stools?"      Throughout the day- no BM today 4. ONSET: "When was the blood first seen in the stools?" (Days or weeks)      yesterday 5. DIARRHEA: "Is there also some diarrhea?" If Yes, ask: "How many diarrhea stools in the past 24 hours?"      yesterday 6. CONSTIPATION: "Do you have constipation?" If Yes, ask: "How bad is it?"     On/off 7. RECURRENT SYMPTOMS: "Have you had blood in your stools before?" If Yes, ask: "When was the last time?" and "What happened that time?"      no 8. BLOOD THINNERS: "Do you take any blood thinners?" (e.g., Coumadin/warfarin, Pradaxa/dabigatran, aspirin)     no 9. OTHER SYMPTOMS: "Do you have any other symptoms?"  (e.g., abdomen pain, vomiting, dizziness, fever)     no 10. PREGNANCY: "Is there any chance you are pregnant?" "When was your last menstrual period?"       No- LMP normal  Protocols used: Rectal Bleeding-A-AH

## 2022-12-22 NOTE — Telephone Encounter (Signed)
  Chief Complaint: rectal bleeding Symptoms: increased swelling, bleeding- mild Frequency: started yesterday Pertinent Negatives: Patient denies constipation  Disposition: [] ED /[] Urgent Care (no appt availability in office) / [x] Appointment(In office/virtual)/ []  [facility]/ [] Home Care/ [] Refused Recommended Disposition /[] [place]/ []  Follow-up with PCP Additional Notes: Patient thinks she may have hemorrhoid bleeding

## 2023-02-03 ENCOUNTER — Encounter: Payer: Self-pay | Admitting: Obstetrics & Gynecology

## 2023-02-03 ENCOUNTER — Telehealth: Payer: Self-pay

## 2023-02-03 ENCOUNTER — Ambulatory Visit (INDEPENDENT_AMBULATORY_CARE_PROVIDER_SITE_OTHER): Payer: Managed Care, Other (non HMO) | Admitting: Obstetrics & Gynecology

## 2023-02-03 ENCOUNTER — Other Ambulatory Visit (HOSPITAL_COMMUNITY)
Admission: RE | Admit: 2023-02-03 | Discharge: 2023-02-03 | Disposition: A | Discharge status: Home / Self Care | Payer: Managed Care, Other (non HMO) | Source: Ambulatory Visit | Attending: Obstetrics & Gynecology | Admitting: Obstetrics & Gynecology

## 2023-02-03 VITALS — BP 118/82 | HR 80 | Resp 16 | Ht 62.25 in | Wt 180.0 lb

## 2023-02-03 DIAGNOSIS — R198 Other specified symptoms and signs involving the digestive system and abdomen: Secondary | ICD-10-CM

## 2023-02-03 DIAGNOSIS — Z3041 Encounter for surveillance of contraceptive pills: Secondary | ICD-10-CM | POA: Diagnosis not present

## 2023-02-03 DIAGNOSIS — K5909 Other constipation: Secondary | ICD-10-CM

## 2023-02-03 DIAGNOSIS — Z01419 Encounter for gynecological examination (general) (routine) without abnormal findings: Secondary | ICD-10-CM

## 2023-02-03 DIAGNOSIS — R197 Diarrhea, unspecified: Secondary | ICD-10-CM

## 2023-02-03 MED ORDER — NORETHIN ACE-ETH ESTRAD-FE 1-20 MG-MCG(24) PO TABS: 1.0000 | ORAL_TABLET | Freq: Every day | ORAL | 4 refills | Status: DC

## 2023-02-03 NOTE — Telephone Encounter (Signed)
Amb referral placed with [facility].  Routed to [place] to follow.

## 2023-02-03 NOTE — Telephone Encounter (Signed)
Refer to Gastro-Enterologist Received: Today Princess Bruins, MD  P [facility] Triage Alternating diarrhea and constipation.  Secondary internal hemorrhoids per patient.

## 2023-02-03 NOTE — Progress Notes (Signed)
Felicia Lambert [birth year] [id number]   History:    32 y.o.  G1P0A1 Single.  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   RP:  Established patient presenting for annual gyn exam    HPI: Well on the generic of LoEstrin Fe 24 1/20.  No BTB.  No pelvic pain.  Not currently sexually active.  Last Pap Neg in 01/2022.  Pap reflex today.  Breasts normal.  Urine normal.  Gastro-intestinal issues with diarrhea and constipation.  Not evaluated by Gertie Fey yet, will refer.  Internal hemorrhoids per patient.  BMI 32.66.  Stationary bike and Yoga.    Past medical history,surgical history, family history and social history were all reviewed and documented in the EPIC chart.  Gynecologic History Patient's last menstrual period was 01/20/2023 (exact date).  Obstetric History OB History  Gravida Para Term Preterm AB Living  1 0 0 0 1 0  SAB IAB Ectopic Multiple Live Births  0 1 0 0 0    # Outcome Date GA Lbr Len/2nd Weight Sex Delivery Anes PTL Lv  1 IAB              ROS: A ROS was performed and pertinent positives and negatives are included in the history. GENERAL: No fevers or chills. HEENT: No change in vision, no earache, sore throat or sinus congestion. NECK: No pain or stiffness. CARDIOVASCULAR: No chest pain or pressure. No palpitations. PULMONARY: No shortness of breath, cough or wheeze. GASTROINTESTINAL: No abdominal pain, nausea, vomiting or diarrhea, melena or bright red blood per rectum. GENITOURINARY: No urinary frequency, urgency, hesitancy or dysuria. MUSCULOSKELETAL: No joint or muscle pain, no back pain, no recent trauma. DERMATOLOGIC: No rash, no itching, no lesions. ENDOCRINE: No polyuria, polydipsia, no heat or cold intolerance. No recent change in weight. HEMATOLOGICAL: No anemia or easy bruising or bleeding. NEUROLOGIC: No headache, seizures, numbness, tingling or weakness. PSYCHIATRIC: No depression, no loss of interest in normal activity or change in sleep pattern.      Exam:   BP 118/82   Pulse 80   Resp 16   Ht 5' 2.25" (1.581 m)   Wt 180 lb (81.6 kg)   LMP 01/20/2023 (Exact Date) Comment: not sexually active-ocps  BMI 32.66 kg/m   Body mass index is 32.66 kg/m.  General appearance : Well developed well nourished female. No acute distress HEENT: Eyes: no retinal hemorrhage or exudates,  Neck supple, trachea midline, no carotid bruits, no thyroidmegaly Lungs: Clear to auscultation, no rhonchi or wheezes, or rib retractions  Heart: Regular rate and rhythm, no murmurs or gallops Breast:Examined in sitting and supine position were symmetrical in appearance, no palpable masses or tenderness,  no skin retraction, no nipple inversion, no nipple discharge, no skin discoloration, no axillary or supraclavicular lymphadenopathy Abdomen: no palpable masses or tenderness, no rebound or guarding Extremities: no edema or skin discoloration or tenderness  Pelvic: Vulva: Normal             Vagina: No gross lesions or discharge  Cervix: No gross lesions or discharge.  Pap reflex done.  Uterus  AV, normal size, shape and consistency, non-tender and mobile  Adnexa  Without masses or tenderness  Perianal area normal   Assessment/Plan:  32 y.o. female for annual exam   1. Encounter for routine gynecological examination with Papanicolaou smear of cervix Well on the generic of LoEstrin Fe 24 1/20.  No BTB.  No pelvic pain.  Not currently sexually active.  Last Pap Neg in 01/2022.  Pap reflex today.  Breasts normal.  Urine normal.  Gastro-intestinal issues with diarrhea and constipation.  Not evaluated by Gertie Fey yet, will refer.  Internal hemorrhoids per patient.  BMI 32.66.  Stationary bike and Yoga.   - Cytology - PAP( [place])  2. Encounter for surveillance of contraceptive pills Well on the BCPs, no CI to continue.  Prescription sent to pharmacy. - Norethindrone Acetate-Ethinyl Estrad-FE (LOESTRIN 24 FE) 1-20 MG-MCG(24) tablet; Take 1 tablet by mouth  daily.  3. Alternating constipation and diarrhea Gastro-intestinal issues with diarrhea and constipation.  Not evaluated by Gertie Fey yet, will refer.  Internal hemorrhoids per patient.  Using Anusol HC.  Other orders - BIOTIN PO; Take by mouth. - Probiotic Product (PROBIOTIC PO); Take by mouth.   Princess Bruins MD, 4:12 PM

## 2023-02-04 ENCOUNTER — Other Ambulatory Visit: Payer: Self-pay | Admitting: Obstetrics & Gynecology

## 2023-02-04 ENCOUNTER — Encounter: Payer: Self-pay | Admitting: Gastroenterology

## 2023-02-04 DIAGNOSIS — Z3041 Encounter for surveillance of contraceptive pills: Secondary | ICD-10-CM

## 2023-02-05 LAB — CYTOLOGY - PAP: Diagnosis: NEGATIVE

## 2023-02-09 ENCOUNTER — Encounter: Payer: Self-pay | Admitting: Obstetrics & Gynecology

## 2023-02-09 ENCOUNTER — Other Ambulatory Visit: Payer: Self-pay

## 2023-02-09 MED ORDER — TINIDAZOLE 500 MG PO TABS: ORAL_TABLET | ORAL | 0 refills | Status: DC

## 2023-02-09 MED ORDER — FLUCONAZOLE 150 MG PO TABS: 150.0000 mg | ORAL_TABLET | Freq: Once | ORAL | 0 refills | Status: AC

## 2023-02-10 ENCOUNTER — Other Ambulatory Visit: Payer: Self-pay

## 2023-02-10 MED ORDER — METRONIDAZOLE 0.75 % VA GEL: VAGINAL | 0 refills | Status: DC

## 2023-03-31 ENCOUNTER — Encounter: Payer: Self-pay | Admitting: Obstetrics & Gynecology

## 2023-04-01 NOTE — Telephone Encounter (Signed)
Will route to provider for final review and close encounter.  

## 2023-04-05 ENCOUNTER — Ambulatory Visit: Payer: 59 | Admitting: Internal Medicine

## 2023-04-06 ENCOUNTER — Ambulatory Visit (INDEPENDENT_AMBULATORY_CARE_PROVIDER_SITE_OTHER): Payer: Managed Care, Other (non HMO) | Admitting: Family Medicine

## 2023-04-06 ENCOUNTER — Encounter: Payer: Self-pay | Admitting: Family Medicine

## 2023-04-06 VITALS — BP 116/78 | HR 85 | Temp 98.9°F | Ht 64.5 in | Wt 184.5 lb

## 2023-04-06 DIAGNOSIS — R635 Abnormal weight gain: Secondary | ICD-10-CM

## 2023-04-06 DIAGNOSIS — R200 Anesthesia of skin: Secondary | ICD-10-CM

## 2023-04-06 DIAGNOSIS — J302 Other seasonal allergic rhinitis: Secondary | ICD-10-CM

## 2023-04-06 DIAGNOSIS — G44229 Chronic tension-type headache, not intractable: Secondary | ICD-10-CM

## 2023-04-06 DIAGNOSIS — J309 Allergic rhinitis, unspecified: Secondary | ICD-10-CM | POA: Insufficient documentation

## 2023-04-06 DIAGNOSIS — H543 Unqualified visual loss, both eyes: Secondary | ICD-10-CM | POA: Insufficient documentation

## 2023-04-06 DIAGNOSIS — R5383 Other fatigue: Secondary | ICD-10-CM

## 2023-04-06 LAB — COMPREHENSIVE METABOLIC PANEL
ALT: 39 U/L — ABNORMAL HIGH (ref 0–35)
AST: 26 U/L (ref 0–37)
Albumin: 4.1 g/dL (ref 3.5–5.2)
Alkaline Phosphatase: 40 U/L (ref 39–117)
BUN: 9 mg/dL (ref 6–23)
CO2: 23 mEq/L (ref 19–32)
Calcium: 9.3 mg/dL (ref 8.4–10.5)
Chloride: 105 mEq/L (ref 96–112)
Creatinine, Ser: 0.78 mg/dL (ref 0.40–1.20)
GFR: 102.61 mL/min (ref >60.00)
Glucose, Bld: 97 mg/dL (ref 70–99)
Potassium: 3.8 mEq/L (ref 3.5–5.1)
Sodium: 137 mEq/L (ref 135–145)
Total Bilirubin: 0.4 mg/dL (ref 0.2–1.2)
Total Protein: 7.1 g/dL (ref 6.0–8.3)

## 2023-04-06 LAB — VITAMIN D 25 HYDROXY (VIT D DEFICIENCY, FRACTURES): VITD: 37.22 ng/mL (ref 30.00–100.00)

## 2023-04-06 LAB — CBC WITH DIFFERENTIAL/PLATELET
Basophils Absolute: 0 10*3/uL (ref 0.0–0.1)
Basophils Relative: 0.5 % (ref 0.0–3.0)
Eosinophils Absolute: 0.2 10*3/uL (ref 0.0–0.7)
Eosinophils Relative: 2.3 % (ref 0.0–5.0)
HCT: 40.5 % (ref 36.0–46.0)
Hemoglobin: 13.7 g/dL (ref 12.0–15.0)
Lymphocytes Relative: 24.4 % (ref 12.0–46.0)
Lymphs Abs: 2 10*3/uL (ref 0.7–4.0)
MCHC: 33.7 g/dL (ref 30.0–36.0)
MCV: 89.8 fl (ref 78.0–100.0)
Monocytes Absolute: 0.5 10*3/uL (ref 0.1–1.0)
Monocytes Relative: 6 % (ref 3.0–12.0)
Neutro Abs: 5.5 10*3/uL (ref 1.4–7.7)
Neutrophils Relative %: 66.8 % (ref 43.0–77.0)
Platelets: 304 10*3/uL (ref 150.0–400.0)
RBC: 4.51 Mil/uL (ref 3.87–5.11)
RDW: 12.8 % (ref 11.5–15.5)
WBC: 8.3 10*3/uL (ref 4.0–10.5)

## 2023-04-06 LAB — HEMOGLOBIN A1C: Hgb A1c MFr Bld: 5.1 % (ref 4.6–6.5)

## 2023-04-06 LAB — B12 AND FOLATE PANEL
Folate: 10.5 ng/mL (ref >5.9)
Vitamin B-12: 323 pg/mL (ref 211–911)

## 2023-04-06 LAB — TSH: TSH: 0.68 u[IU]/mL (ref 0.35–5.50)

## 2023-04-06 NOTE — Assessment & Plan Note (Signed)
Intermittent, chronic for the last 3-4 years. Differential is very broad and can include metabolic, cardiogenic, or neurologic causes. They may be associated with her physical or anxiety symptoms. I will start her work up with labs to look for the most common causes first. Then work up will progress as needed depending on the results. She may need referral to neurology to rule out migraine related causes vs multiple sclerosis.

## 2023-04-06 NOTE — Progress Notes (Signed)
New Patient Office Visit  Subjective    Patient ID: Felicia Lambert, female    DOB: 12-Jun-1991  Age: 32 y.o. MRN: [id number]  CC:  Chief Complaint  Patient presents with   Establish Care    HPI Felicia Lambert presents to establish care Pt was seeing Dr. Cheron Schaumann previously, also seeing [facility] for GYN care. Patient reporting today that in December 2020 she was very ill, thought it might have been COVID but they did not have a test for it at the time. Had peripheral vision loss, hold/cold sensation, sweating,etc. No difficulty breathing, felt like she was going to pass out, maybe a chest tightness. States she was very dizzy at the time as well. States she went to urgent care and was treated for an "ear infection". Since then she has had repeat episodes of the vision loss and the dizziness. States that she had 2 episodes of the vision loss, but had multiple episodes of dizziness. States that last week she had an episodes where she lost her vision in the right eye mostly, states that it felt like she had "stared at the sun for too long" states the episode lasted for 1 hour, then resolved. No pain associated with the vision loss. States she does have seasonal allergies and she gets headaches from congestion, other times her headaches originate in the back of her neck and head. States that taking allergy medications help with the sinus headaches. States that sometimes she feels like numbness in the hands and feet occasionally. Also in the last couple of months  she has felt a little more short of breath. Also has had unintentional weight gain also in the last 2 months without changing her diet or exercise.  Pt also has a history of GI issues since she was a teenager. States that she has an appointment with a GI doctor soon to discuss. Has been on OCP's since she was 16. She does report anxiety symptoms as well, but states she has never been on medication for this.   Her GI issues are mostly described as  constipation alternating with diarrhea, states that she feels a "crick" in the left upper quadrant, states that she feels her bowel sounds are very noisy, has been this way for at least 10 years or longer.  Current Outpatient Medications  Medication Instructions   Ascorbic Acid (VITAMIN C PO) Oral   BIOTIN PO Oral, Daily   EPINEPHrine (EPI-PEN) 0.3 mg, Intramuscular, As needed   hydrocortisone (ANUSOL-HC) 25 mg, Rectal, 2 times daily, For 7 days   loratadine (CLARITIN) 10 mg, Oral, Daily   Norethindrone Acetate-Ethinyl Estrad-FE (LOESTRIN 24 FE) 1-20 MG-MCG(24) tablet 1 tablet, Oral, Daily   polyethylene glycol powder (GLYCOLAX/MIRALAX) 17 g, Oral, 2 times daily PRN   Probiotic Product (PROBIOTIC PO) Oral   SALINE NASAL SPRAY NA As needed   VITAMIN D PO 50 mg, Oral, Daily    Past Medical History:  Diagnosis Date   COVID-19    02/2020   Hemorrhoids     Past Surgical History:  Procedure Laterality Date   TONSILLECTOMY  2014   WISDOM TOOTH EXTRACTION  2011    Family History  Problem Relation Age of Onset   Stroke Father    Thyroid disease Father    Pulmonary embolism Father        thought related to Covid diagnosis   Deep vein thrombosis Father    Factor V Leiden deficiency Father        prothrombin  gene mutation   CVA Father    Hypothyroidism Paternal Aunt    Hypothyroidism Paternal Grandfather     Social History   Socioeconomic History   Marital status: Single    Spouse name: Not on file   Number of children: Not on file   Years of education: College   Highest education level: Bachelor's degree (e.g., BA, AB, BS)  Occupational History   Not on file  Tobacco Use   Smoking status: Never   Smokeless tobacco: Never  Vaping Use   Vaping Use: Never used  Substance and Sexual Activity   Alcohol use: Not Currently    Comment: occasionally   Drug use: No   Sexual activity: Not Currently    Partners: Male    Birth control/protection: OCP    Comment: 1ST  INTERCOURSE- 65, PARTNERS- more than 5  Other Topics Concern   Not on file  Social History Narrative   Not on file   Social Determinants of Health   Financial Resource Strain: Not on file  Food Insecurity: Not on file  Transportation Needs: Not on file  Physical Activity: Not on file  Stress: Not on file  Social Connections: Not on file  Intimate Partner Violence: Not on file    Review of Systems  Constitutional:  Negative for chills, fever and weight loss.  Respiratory:  Negative for cough and shortness of breath.   Gastrointestinal:  Positive for constipation and diarrhea. Negative for nausea and vomiting.  Musculoskeletal:  Negative for back pain and joint pain.  Neurological:  Positive for dizziness, tingling and headaches. Negative for seizures and weakness.  Psychiatric/Behavioral:  The patient is nervous/anxious.   All other systems reviewed and are negative.       Objective    BP 116/78 (BP Location: Left Arm, Patient Position: Sitting, Cuff Size: Large)   Pulse 85   Temp 98.9 F (37.2 C) (Oral)   Ht 5' 4.5" (1.638 m)   Wt 184 lb 8 oz (83.7 kg)   LMP 03/16/2023 (Approximate)   SpO2 98%   BMI 31.18 kg/m   Physical Exam Vitals reviewed.  Constitutional:      Appearance: Normal appearance. She is well-groomed and normal weight.  Eyes:     Conjunctiva/sclera: Conjunctivae normal.  Neck:     Thyroid: No thyromegaly.  Cardiovascular:     Rate and Rhythm: Normal rate and regular rhythm.     Pulses: Normal pulses.     Heart sounds: S1 normal and S2 normal.  Pulmonary:     Effort: Pulmonary effort is normal.     Breath sounds: Normal breath sounds and air entry.  Abdominal:     General: Bowel sounds are normal.  Musculoskeletal:     Right lower leg: No edema.     Left lower leg: No edema.  Neurological:     Mental Status: She is alert and oriented to person, place, and time. Mental status is at baseline.     Gait: Gait is intact.  Psychiatric:         Mood and Affect: Mood and affect normal.        Speech: Speech normal.        Behavior: Behavior normal.        Judgment: Judgment normal.         Assessment & Plan:  Weight gain -     Pt has multiple complaints today, related mostly to these episodes of dizziness and vision loss, these episodes are transient,  maybe related to her GI symptoms at times but sometimes not, not really related to her headaches, but it is difficult to say at this time. I will order a ful lset of bloodwork to look for abnormalities. If her labs return normal then most likely she will need to see a specialist for further recommendations.   Comprehensive metabolic panel -     CBC with Differential/Platelet -     TSH -     Hemoglobin A1c  Bilateral hand numbness -     B12 and Folate Panel  Other fatigue -     VITAMIN D 25 Hydroxy (Vit-D Deficiency, Fractures)  Chronic tension-type headache, not intractable  Seasonal allergic rhinitis, unspecified trigger  Vision loss, bilateral Assessment & Plan: Intermittent, chronic for the last 3-4 years. Differential is very broad and can include metabolic, cardiogenic, or neurologic causes. They may be associated with her physical or anxiety symptoms. I will start her work up with labs to look for the most common causes first. Then work up will progress as needed depending on the results. She may need referral to neurology to rule out migraine related causes vs multiple sclerosis.      Return in about 6 months (around 10/07/2023).   Karie Georges, MD

## 2023-04-07 ENCOUNTER — Encounter: Payer: Self-pay | Admitting: Family Medicine

## 2023-04-07 NOTE — Addendum Note (Signed)
Addended by: Karie Georges on: 04/07/2023 02:35 PM   Modules accepted: Orders

## 2023-04-08 ENCOUNTER — Ambulatory Visit (INDEPENDENT_AMBULATORY_CARE_PROVIDER_SITE_OTHER): Payer: Managed Care, Other (non HMO) | Admitting: Gastroenterology

## 2023-04-08 ENCOUNTER — Encounter: Payer: Self-pay | Admitting: Neurology

## 2023-04-08 ENCOUNTER — Encounter: Payer: Self-pay | Admitting: Gastroenterology

## 2023-04-08 ENCOUNTER — Other Ambulatory Visit: Payer: Managed Care, Other (non HMO)

## 2023-04-08 VITALS — BP 130/80 | HR 91 | Ht 62.0 in | Wt 184.0 lb

## 2023-04-08 DIAGNOSIS — K589 Irritable bowel syndrome without diarrhea: Secondary | ICD-10-CM

## 2023-04-08 NOTE — Progress Notes (Signed)
HPI : Felicia Lambert is a 32 y.o. female who is referred to [place] by Saralyn Pilar *for further evaluation of multiple chronic GI symptoms.  The patient states that she has had bothersome GI symptoms for at least 15 years.  She reports having problems with crampy abdominal pain/discomfort, bloating, abdominal distention and irregular bowel habits, going several days without a bowel movement followed by multiple days of loose stools.  Her stool consistency varies, but typically is on the softer or poorly formed side.  Rarely will she have small hard stools.  Straining with defecation is a chronic problem, even with poorly formed stools.  She frequently has the sensation of incomplete evacuation.  She has had problems with hemorrhoids and was previously diagnosed with an anal fissure. She is bothered by a persistent discomfort in her left upper quadrant.  She has a sensation that "things get stuck" there.  She also describes loud digestive noises and a "creaking" sound in her abdomen.  She has not noticed any particular foods that tend to cause worse symptoms than others.  She has not tried taking any medications for her abdominal pain or irregular bowel habits, but did take MiraLAX and topical diltiazem for her fissure earlier this year..  She has not tried any elimination diets.  No family history of inflammatory bowel disease, celiac disease or GI malignancy.  Past Medical History:  Diagnosis Date   COVID-19    02/2020   Hemorrhoids      Past Surgical History:  Procedure Laterality Date   TONSILLECTOMY  2014   WISDOM TOOTH EXTRACTION  2011   Family History  Problem Relation Age of Onset   Stroke Father    Thyroid disease Father    Pulmonary embolism Father        thought related to Covid diagnosis   Deep vein thrombosis Father    Factor V Leiden deficiency Father        prothrombin gene mutation   CVA Father    Hypothyroidism Paternal Grandfather    Hypothyroidism Paternal Aunt     Colon cancer Neg Hx    Esophageal cancer Neg Hx    Stomach cancer Neg Hx    Social History   Tobacco Use   Smoking status: Never   Smokeless tobacco: Never  Vaping Use   Vaping Use: Never used  Substance Use Topics   Alcohol use: Not Currently    Comment: 0-2 a day   Drug use: No   Current Outpatient Medications  Medication Sig Dispense Refill   Ascorbic Acid (VITAMIN C PO) Take by mouth.     BIOTIN PO Take by mouth daily.     EPINEPHrine 0.3 mg/0.3 mL IJ SOAJ injection Inject 0.3 mg into the muscle as needed for anaphylaxis. 1 each 0   hydrocortisone (ANUSOL-HC) 25 MG suppository Place 1 suppository (25 mg total) rectally 2 (two) times daily. For 7 days 14 suppository 3   loratadine (CLARITIN) 10 MG tablet Take 10 mg by mouth daily.     Norethindrone Acetate-Ethinyl Estrad-FE (LOESTRIN 24 FE) 1-20 MG-MCG(24) tablet Take 1 tablet by mouth daily. 84 tablet 4   polyethylene glycol powder (GLYCOLAX/MIRALAX) 17 GM/SCOOP powder Take 17 g by mouth 2 (two) times daily as needed. 225 g 1   Probiotic Product (PROBIOTIC PO) Take by mouth.     SALINE NASAL SPRAY NA as needed.     VITAMIN D PO Take 50 mg by mouth daily.     No current facility-administered  medications for this visit.   Allergies  Allergen Reactions   Peanut-Containing Drug Products Anaphylaxis   Amoxicillin     GI issues   Apple Juice    Avocado    Cefdinir     Dizzy,flushed   Seasonal Ic [Cholestatin]    Watermelon Flavor      Review of Systems: All systems reviewed and negative except where noted in HPI.    No results found.  Physical Exam: BP 130/80   Pulse 91   Ht 5\' 2"  (1.575 m)   Wt 184 lb (83.5 kg)   LMP 03/16/2023 (Approximate)   BMI 33.65 kg/m  Constitutional: Pleasant,well-developed, Caucasian female in no acute distress. HEENT: Normocephalic and atraumatic. Conjunctivae are normal. No scleral icterus. Neck supple.  Cardiovascular: Normal rate, regular rhythm.  Pulmonary/chest: Effort  normal and breath sounds normal. No wheezing, rales or rhonchi. Abdominal: Soft, nondistended, diffuse tenderness to palpation in the upper abdomen bowel, without rigidity or guarding sounds active throughout. There are no masses palpable. No hepatomegaly. Extremities: no edema Lymphadenopathy: No cervical adenopathy noted. Neurological: Alert and oriented to person place and time. Skin: Skin is warm and dry. No rashes noted. Psychiatric: Normal mood and affect. Behavior is normal.  CBC    Component Value Date/Time   WBC 8.3 04/06/2023 0943   RBC 4.51 04/06/2023 0943   HGB 13.7 04/06/2023 0943   HGB 12.5 11/26/2016 1043   HCT 40.5 04/06/2023 0943   PLT 304.0 04/06/2023 0943   MCV 89.8 04/06/2023 0943   MCH 30.1 03/19/2020 0816   MCHC 33.7 04/06/2023 0943   RDW 12.8 04/06/2023 0943   LYMPHSABS 2.0 04/06/2023 0943   MONOABS 0.5 04/06/2023 0943   EOSABS 0.2 04/06/2023 0943   BASOSABS 0.0 04/06/2023 0943    CMP     Component Value Date/Time   NA 137 04/06/2023 0943   K 3.8 04/06/2023 0943   CL 105 04/06/2023 0943   CO2 23 04/06/2023 0943   GLUCOSE 97 04/06/2023 0943   BUN 9 04/06/2023 0943   CREATININE 0.78 04/06/2023 0943   CREATININE 0.71 01/10/2021 1057   CALCIUM 9.3 04/06/2023 0943   PROT 7.1 04/06/2023 0943   ALBUMIN 4.1 04/06/2023 0943   AST 26 04/06/2023 0943   ALT 39 (H) 04/06/2023 0943   ALKPHOS 40 04/06/2023 0943   BILITOT 0.4 04/06/2023 0943   GFRNONAA 102 03/19/2020 0816   GFRAA 118 03/19/2020 0816       Latest Ref Rng & Units 04/06/2023    9:43 AM 03/19/2020    8:16 AM 11/26/2016   10:43 AM  CBC EXTENDED  WBC 4.0 - 10.5 K/uL 8.3  9.4    RBC 3.87 - 5.11 Mil/uL 4.51  4.22    Hemoglobin 12.0 - 15.0 g/dL 09.8  11.9  14.7   HCT 36.0 - 46.0 % 40.5  39.0    Platelets 150.0 - 400.0 K/uL 304.0  301    NEUT# 1.4 - 7.7 K/uL 5.5  6,768    Lymph# 0.7 - 4.0 K/uL 2.0  1,607        ASSESSMENT AND PLAN: 32 year old female with longstanding history of crampy  abdominal pain, bloating/distention, irregular bowel habits characterized by going several days without bowel movements, followed by multiple loose stools, with straining with defecation and sensation of incomplete evacuation.  The clinical history is most consistent with irritable bowel syndrome.  She may also have a degree of pelvic floor dyssynergia given her chronic straining even with soft stools.  We discussed the proposed pathophysiology of IBS and gut brain axis disorders in general.  We discussed management of IBS, to include use of medications to improve bowel habits, as needed pain medicine, centrally acting neuromodulators, role of empiric dietary modifications to include a low FODMAP diet gluten-free diet, as well as the role of cognitive therapies.  We discussed the goals of IBS management, namely to minimize the impact of GI symptoms on quality of life.    I recommended she start taking Metamucil on a daily basis.  She was provided information on low FODMAP diet, and will look further into it. Will screen for H. pylori and celiac disease. Plan follow-up in 8 weeks and consider other therapies.  The patient was noted to have a very mildly elevated ALT on recent labs.  Will plan to recheck this in 2-3 months.  IBS - Metamucil daily - Check TTG/IgA, H. Pylori stool antigen - low FODMAP diet - F/u 8 weeks  Elevated ALT - Repeat in 2-3 months  I spent a total of 45 minutes reviewing the patient's medical record, interviewing and examining the patient, discussing her diagnosis and management of her condition going forward, and documenting in the medical record   Shawnee Gambone E. Tomasa Rand, MD [facility]   CC:  Saralyn Pilar *

## 2023-04-08 NOTE — Patient Instructions (Addendum)
_______________________________________________________  If your blood pressure at your visit was 140/90 or greater, please contact your primary care physician to follow up on this.  _______________________________________________________  If you are age 30 or older, your body mass index should be between 23-30. Your Body mass index is 33.65 kg/m. If this is out of the aforementioned range listed, please consider follow up with your Primary Care Provider.  If you are age 74 or younger, your body mass index should be between 19-25. Your Body mass index is 33.65 kg/m. If this is out of the aformentioned range listed, please consider follow up with your Primary Care Provider.   Your provider has requested that you go to the basement level for lab work before leaving today. Press "B" on the elevator. The lab is located at the first door on the left as you exit the elevator.   Follow Low fodmap diet.   Please purchase Metamucil over the counter. Take as directed.    The [facility] providers would like to encourage you to use [facility] to communicate with providers for non-urgent requests or questions.  Due to long hold times on the telephone, sending your provider a message by [facility] may be a faster and more efficient way to get a response.  Please allow 48 business hours for a response.  Please remember that this is for non-urgent requests.   It was a pleasure to see you today!  Thank you for trusting me with your gastrointestinal care!    Scott E.Tomasa Rand, MD

## 2023-04-09 LAB — TISSUE TRANSGLUTAMINASE, IGA: (tTG) Ab, IgA: <1 U/mL

## 2023-04-09 LAB — IGA: Immunoglobulin A: 107 mg/dL (ref 47–310)

## 2023-04-10 DIAGNOSIS — K589 Irritable bowel syndrome without diarrhea: Secondary | ICD-10-CM | POA: Insufficient documentation

## 2023-04-12 NOTE — Progress Notes (Signed)
Ladona Ridgel,  Your test for celiac disease was negative (normal).  Please submit the stool sample for H. Pylori testing if you have not already.

## 2023-04-14 ENCOUNTER — Other Ambulatory Visit: Payer: Managed Care, Other (non HMO)

## 2023-04-14 DIAGNOSIS — K589 Irritable bowel syndrome without diarrhea: Secondary | ICD-10-CM

## 2023-04-16 LAB — H. PYLORI ANTIGEN, STOOL: H pylori Ag, Stl: NEGATIVE

## 2023-04-20 NOTE — Progress Notes (Signed)
Felicia Lambert,  Your test for H. Pylori was negative (bacteria not present).  As discussed, your symptoms are from irritable bowel syndrome.  Please take the metamucil daily as discussed, consider the low FODMAP diet and follow up with me in the office.

## 2023-04-28 ENCOUNTER — Ambulatory Visit: Payer: Managed Care, Other (non HMO) | Admitting: Internal Medicine

## 2023-05-11 NOTE — Progress Notes (Unsigned)
NEUROLOGY CONSULTATION NOTE  Lou Irigoyen MRN: [id number] DOB: 08/16/1991  Referring provider: Nira Conn, MD Primary care provider: Nira Conn, MD  Reason for consult:  headache, hand numbness  Assessment/Plan:   Chronic migraine without aura, without status migrainosus, not intractable Probable ocular migraines Lightheadedness - does not seem to be neurologic.  Reports remote history of recurrent syncope as a child.  Previous cardiac workup for palpitations was negative. Unclear if vasovagal related to GI upset from IBS or related to her anxiety.  Improved since she modified her diet.   Numbness of hands and feet.  Unclear etiology.  Description vague and not consistent with paresthesias or neuropathy. Transient episode of vision loss.  Unclear if visual obscuration vs migraine   Will check MRI of brain with and without contrast Will refer to ophthalmology for formal eye exam to evaluate for possible papilledema.  Low suspicion for IIH but given her symptoms (headache, dizziness, visual obscurations), should be evaluated. Limit use of pain relievers to no more than 2 days out of week to prevent risk of rebound or medication-overuse headache. Keep headache diary Follow up   Subjective:  Felicia Lambert is a 32 year old female with irritable bowel syndrome and anxiety who presents for headaches, hand numbness and visual disturbance.  History supplemented by referring provider's notes.  She had a viral illness in December 2020 presenting as flu-like symptoms with peripheral vision loss.  Unclear if it was COVID because they did not have testing at that time.  Since then, she has had various chronic symptomatology.   She has had migraines all of her life but started having increased headaches.  It is a pressure/throbbing involving various locations on her head, but typically back of head (unilateral or bilateral) when severe.  Unsure if it is positional.  Associated with  photophobia and phonophobia but not nausea.  Lasts 1 to several hours.  Treats with ibuprofen about once a week.  Occurs 3 to 5 days a week.  Needs to take a nap.    She has had episodes of dizziness, described as lightheadedness, not spinning.  It occurs spontaneously.  She will feel hot, diaphoretic, palpitations, chest tightness, tunnel vision and sensation that she is going to pass out but doesn't.  Lasts several hours.  Used to occur frequently.  She was diagnosed with IBS and started on a low FODMAP diet.  These episodes have dramatically decreased.  On a few occasions, she has had visual symptoms described as seeing TV static in her peripheral vision lasting 20-30 minutes.  No associated headache.  One time, she had blackout of central vision in both eyes that lasted an hour.  Last eye exam was two years ago.    She reports episodes of a sensory disturbance in her hands and feet.  She describes it as an "emptiness".  It is not numb, tingling, or burning.  It usually occurs at work when she has been sitting at her desk on the computer.  Will get up and move around.  Resolves after 30 minutes.  Frequency variable - may occur daily for a week and then 2 weeks without episode.     Prior blood work has been unremarkable, including CBC, CMP, TSH 0.68, vit D 37.22, B12 323, folate 10.5, Hgb A1c 5.1, negative HIV, negative RPR  She has longstanding history of anxiety.  She reports that she had passed out on several occasions as a child into her teens.  Thought to be due to  low blood sugar.  Never saw the doctor.  She was previously evaluated by cardiology for palpitations.  Workup reportedly negative.    She reports that she had hit her head on several occasions as a child.     PAST MEDICAL HISTORY: Past Medical History:  Diagnosis Date   COVID-19    02/2020   Hemorrhoids     PAST SURGICAL HISTORY: Past Surgical History:  Procedure Laterality Date   TONSILLECTOMY  2014   WISDOM TOOTH  EXTRACTION  2011    MEDICATIONS: Current Outpatient Medications on File Prior to Visit  Medication Sig Dispense Refill   Ascorbic Acid (VITAMIN C PO) Take by mouth.     BIOTIN PO Take by mouth daily.     EPINEPHrine 0.3 mg/0.3 mL IJ SOAJ injection Inject 0.3 mg into the muscle as needed for anaphylaxis. 1 each 0   hydrocortisone (ANUSOL-HC) 25 MG suppository Place 1 suppository (25 mg total) rectally 2 (two) times daily. For 7 days 14 suppository 3   loratadine (CLARITIN) 10 MG tablet Take 10 mg by mouth daily.     Norethindrone Acetate-Ethinyl Estrad-FE (LOESTRIN 24 FE) 1-20 MG-MCG(24) tablet Take 1 tablet by mouth daily. 84 tablet 4   polyethylene glycol powder (GLYCOLAX/MIRALAX) 17 GM/SCOOP powder Take 17 g by mouth 2 (two) times daily as needed. 225 g 1   Probiotic Product (PROBIOTIC PO) Take by mouth.     SALINE NASAL SPRAY NA as needed.     VITAMIN D PO Take 50 mg by mouth daily.     No current facility-administered medications on file prior to visit.    ALLERGIES: Allergies  Allergen Reactions   Peanut-Containing Drug Products Anaphylaxis   Amoxicillin     GI issues   Apple Juice    Avocado    Cefdinir     Dizzy,flushed   Seasonal Ic [Cholestatin]    Watermelon Flavor     FAMILY HISTORY: Family History  Problem Relation Age of Onset   Stroke Father    Thyroid disease Father    Pulmonary embolism Father        thought related to Covid diagnosis   Deep vein thrombosis Father    Factor V Leiden deficiency Father        prothrombin gene mutation   CVA Father    Hypothyroidism Paternal Grandfather    Hypothyroidism Paternal Aunt    Colon cancer Neg Hx    Esophageal cancer Neg Hx    Stomach cancer Neg Hx     Objective:  Blood pressure 124/77, pulse 76, height 5\' 2"  (1.575 m), weight 182 lb (82.6 kg), SpO2 92 %. General: No acute distress.  Patient appears well-groomed.   Head:  Normocephalic/atraumatic Eyes:  fundi examined but not visualized Neck: supple, no  paraspinal tenderness, full range of motion Back: No paraspinal tenderness Heart: regular rate and rhythm Lungs: Clear to auscultation bilaterally. Vascular: No carotid bruits. Neurological Exam: Mental status: alert and oriented to person, place, and time, speech fluent and not dysarthric, language intact. Cranial nerves: CN I: not tested CN II: pupils equal, round and reactive to light, visual fields intact CN III, IV, VI:  full range of motion, no nystagmus, no ptosis CN V: facial sensation intact. CN VII: upper and lower face symmetric CN VIII: hearing intact CN IX, X: gag intact, uvula midline CN XI: sternocleidomastoid and trapezius muscles intact CN XII: tongue midline Bulk & Tone: normal, no fasciculations. Motor:  muscle strength 5/5 throughout Sensation:  Pinprick,  temperature and vibratory sensation intact. Deep Tendon Reflexes:  2+ throughout,  toes downgoing.   Finger to nose testing:  Without dysmetria.   Heel to shin:  Without dysmetria.   Gait:  Normal station and stride.  Romberg negative.    Thank you for allowing me to take part in the care of this patient.  Shon Millet, DO  CC: Nira Conn, MD

## 2023-05-12 ENCOUNTER — Encounter: Payer: Self-pay | Admitting: Neurology

## 2023-05-12 ENCOUNTER — Ambulatory Visit (INDEPENDENT_AMBULATORY_CARE_PROVIDER_SITE_OTHER): Payer: Managed Care, Other (non HMO) | Admitting: Neurology

## 2023-05-12 VITALS — BP 124/77 | HR 76 | Ht 62.0 in | Wt 182.0 lb

## 2023-05-12 DIAGNOSIS — R42 Dizziness and giddiness: Secondary | ICD-10-CM | POA: Diagnosis not present

## 2023-05-12 DIAGNOSIS — G43709 Chronic migraine without aura, not intractable, without status migrainosus: Secondary | ICD-10-CM | POA: Diagnosis not present

## 2023-05-12 DIAGNOSIS — R200 Anesthesia of skin: Secondary | ICD-10-CM | POA: Diagnosis not present

## 2023-05-12 DIAGNOSIS — H543 Unqualified visual loss, both eyes: Secondary | ICD-10-CM

## 2023-05-12 NOTE — Patient Instructions (Signed)
  Check MRI of brain with and without contrast Limit use of pain relievers to no more than 2 days out of the week.  These medications include acetaminophen, NSAIDs (ibuprofen/Advil/Motrin, naproxen/Aleve, triptans (Imitrex/sumatriptan), Excedrin, and narcotics.  This will help reduce risk of rebound headaches. Be aware of common food triggers Routine exercise Stay adequately hydrated (aim for 64 oz water daily) Keep headache diary Maintain proper stress management Maintain proper sleep hygiene Do not skip meals Consider supplements:  magnesium citrate 400mg  daily, riboflavin 400mg  daily, coenzyme Q10 300mg  daily. Follow up 6 months.

## 2023-05-25 ENCOUNTER — Other Ambulatory Visit: Payer: Managed Care, Other (non HMO)

## 2023-06-25 ENCOUNTER — Encounter: Payer: Self-pay | Admitting: Neurology

## 2023-06-26 ENCOUNTER — Ambulatory Visit
Admission: RE | Admit: 2023-06-26 | Discharge: 2023-06-26 | Disposition: A | Discharge status: Home / Self Care | Payer: Managed Care, Other (non HMO) | Source: Ambulatory Visit | Attending: Neurology | Admitting: Neurology

## 2023-06-26 DIAGNOSIS — R200 Anesthesia of skin: Secondary | ICD-10-CM

## 2023-06-26 DIAGNOSIS — H543 Unqualified visual loss, both eyes: Secondary | ICD-10-CM

## 2023-06-26 DIAGNOSIS — R42 Dizziness and giddiness: Secondary | ICD-10-CM

## 2023-06-26 DIAGNOSIS — G43709 Chronic migraine without aura, not intractable, without status migrainosus: Secondary | ICD-10-CM

## 2023-06-26 MED ORDER — GADOPICLENOL 0.5 MMOL/ML IV SOLN
7.0000 mL | Freq: Once | INTRAVENOUS | Status: AC | PRN
  Administered 2023-06-26: 7 mL via INTRAVENOUS

## 2023-07-01 ENCOUNTER — Encounter: Payer: Self-pay | Admitting: Neurology

## 2023-07-02 NOTE — Progress Notes (Signed)
LMOVM for patient to call office.

## 2023-07-06 ENCOUNTER — Other Ambulatory Visit (INDEPENDENT_AMBULATORY_CARE_PROVIDER_SITE_OTHER): Payer: 59

## 2023-07-06 ENCOUNTER — Encounter: Payer: Self-pay | Admitting: Gastroenterology

## 2023-07-06 ENCOUNTER — Ambulatory Visit (INDEPENDENT_AMBULATORY_CARE_PROVIDER_SITE_OTHER): Payer: Managed Care, Other (non HMO) | Admitting: Gastroenterology

## 2023-07-06 VITALS — BP 112/76 | HR 86 | Ht 62.0 in | Wt 178.0 lb

## 2023-07-06 DIAGNOSIS — R7401 Elevation of levels of liver transaminase levels: Secondary | ICD-10-CM | POA: Diagnosis not present

## 2023-07-06 DIAGNOSIS — K589 Irritable bowel syndrome without diarrhea: Secondary | ICD-10-CM

## 2023-07-06 LAB — COMPREHENSIVE METABOLIC PANEL
ALT: 35 U/L (ref 0–35)
AST: 24 U/L (ref 0–37)
Albumin: 4.3 g/dL (ref 3.5–5.2)
Alkaline Phosphatase: 41 U/L (ref 39–117)
BUN: 9 mg/dL (ref 6–23)
CO2: 25 mEq/L (ref 19–32)
Calcium: 9.3 mg/dL (ref 8.4–10.5)
Chloride: 105 mEq/L (ref 96–112)
Creatinine, Ser: 0.8 mg/dL (ref 0.40–1.20)
GFR: 99.37 mL/min (ref >60.00)
Glucose, Bld: 122 mg/dL — ABNORMAL HIGH (ref 70–99)
Potassium: 3.7 mEq/L (ref 3.5–5.1)
Sodium: 138 mEq/L (ref 135–145)
Total Bilirubin: 0.4 mg/dL (ref 0.2–1.2)
Total Protein: 7.1 g/dL (ref 6.0–8.3)

## 2023-07-06 NOTE — Patient Instructions (Signed)
_______________________________________________________  If your blood pressure at your visit was 140/90 or greater, please contact your primary care physician to follow up on this.  Your provider has requested that you go to the basement level for lab work before leaving today. Press "B" on the elevator. The lab is located at the first door on the left as you exit the elevator.    If you are age 30 or older, your body mass index should be between 23-30. Your Body mass index is 32.56 kg/m. If this is out of the aforementioned range listed, please consider follow up with your Primary Care Provider.  If you are age 82 or younger, your body mass index should be between 19-25. Your Body mass index is 32.56 kg/m. If this is out of the aformentioned range listed, please consider follow up with your Primary Care Provider.    The [facility] providers would like to encourage you to use [facility] to communicate with providers for non-urgent requests or questions.  Due to long hold times on the telephone, sending your provider a message by [facility] may be a faster and more efficient way to get a response.  Please allow 48 business hours for a response.  Please remember that this is for non-urgent requests.   It was a pleasure to see you today!  Thank you for trusting me with your gastrointestinal care!    Scott E.Tomasa Rand, MD

## 2023-07-06 NOTE — Progress Notes (Signed)
Felicia Lambert,  Your liver enzymes were in the upper range of normal, but technically normal.  I would recommend we repeat your liver enzymes in 1 year to make sure they are remaining within normal range.

## 2023-07-06 NOTE — Progress Notes (Signed)
HPI : Felicia Lambert is a 31 y.o. female with a history of IBS who presents for follow-up.  She was initially seen by me May 23 with multiple chronic GI symptoms of of at least 15 years duration.  She reported issues with crampy abdominal pain/discomfort, bloating and abdominal distention with irregular bowel habits characterized by multiple days of loose stools or several days without bowel movements, straining with defecation and incomplete evacuation.  She also has a history of anal fissures treated with topical diltiazem. At that visit, it was recommended she consider a low FODMAP diet and to take Metamucil daily.  Celiac serologies and H. pylori stool antigen were checked and were negative.  Today, the patient reports improvement overall in her symptoms.  She has been following a low FODMAP diet.  She did not try taking fiber supplements because they were too pricey.  She states that all of her symptoms have shown some improvement, but are not resolved.  She still has some issues with urgency and sensation of incomplete evacuation.  She typically goes every day in the morning, and then has 2 or 3 more bowel movements shortly thereafter.  Bloating and abdominal distention remain a persistent problem and is her most bothersome complaint.  She reports that the "creaking" sound in her left upper quadrant has improved. She has noted that on the low FODMAP diet, her appetite is decreased and she does not eat as much.  She has lost about 5 pounds since her last visit.  She is also essentially following a gluten-free diet as well.  She was noted to have a mildly elevated ALT back in May.  Previous liver enzymes had been historically normal.  She drinks alcohol occasionally.  She reports that her father has a history of "enlarged liver", but does not have cirrhosis.    Past Medical History:  Diagnosis Date   COVID-19    02/2020   Hemorrhoids    IBS (irritable bowel syndrome)     Past Surgical History:   Procedure Laterality Date   TONSILLECTOMY  2014   WISDOM TOOTH EXTRACTION  2011   Family History  Problem Relation Age of Onset   Neuropathy Father    Stroke Father    Thyroid disease Father    Pulmonary embolism Father        thought related to Covid diagnosis   Deep vein thrombosis Father    Factor V Leiden deficiency Father        prothrombin gene mutation   CVA Father    Hypothyroidism Paternal Aunt    Hypothyroidism Paternal Grandfather    Colon cancer Neg Hx    Esophageal cancer Neg Hx    Stomach cancer Neg Hx    Social History   Tobacco Use   Smoking status: Never   Smokeless tobacco: Never  Vaping Use   Vaping status: Never Used  Substance Use Topics   Alcohol use: Yes    Comment: occ   Drug use: No   Current Outpatient Medications  Medication Sig Dispense Refill   BIOTIN PO Take by mouth daily.     EPINEPHrine 0.3 mg/0.3 mL IJ SOAJ injection Inject 0.3 mg into the muscle as needed for anaphylaxis. 1 each 0   loratadine (CLARITIN) 10 MG tablet Take 10 mg by mouth daily.     Norethindrone Acetate-Ethinyl Estrad-FE (LOESTRIN 24 FE) 1-20 MG-MCG(24) tablet Take 1 tablet by mouth daily. 84 tablet 4   polyethylene glycol powder (GLYCOLAX/MIRALAX) 17 GM/SCOOP powder  Take 17 g by mouth 2 (two) times daily as needed. (Patient taking differently: Take 17 g by mouth as needed.) 225 g 1   Probiotic Product (PROBIOTIC PO) Take by mouth.     SALINE NASAL SPRAY NA as needed.     VITAMIN D PO Take 50 mg by mouth daily.     hydrocortisone (ANUSOL-HC) 25 MG suppository Place 1 suppository (25 mg total) rectally 2 (two) times daily. For 7 days (Patient not taking: Reported on 07/06/2023) 14 suppository 3   No current facility-administered medications for this visit.   Allergies  Allergen Reactions   Peanut-Containing Drug Products Anaphylaxis   Amoxicillin     GI issues   Apple Juice    Avocado    Cefdinir     Dizzy,flushed   Seasonal Ic [Cholestatin]    Watermelon  Flavor      Review of Systems: All systems reviewed and negative except where noted in HPI.    MR BRAIN W WO CONTRAST  Result Date: 07/01/2023 CLINICAL DATA:  Bilateral vision loss, hand numbness. Chronic migraines and pressure behind right eye. EXAM: MRI HEAD WITHOUT AND WITH CONTRAST TECHNIQUE: Multiplanar, multiecho pulse sequences of the brain and surrounding structures were obtained without and with intravenous contrast. CONTRAST:  7 cc Vueway COMPARISON:  None Available. FINDINGS: Brain: There is no acute intracranial hemorrhage, extra-axial fluid collection, or acute infarct. Parenchymal volume is normal. The ventricles are normal in size. Parenchymal signal is normal, with no evidence of demyelinating disease. The pituitary and suprasellar region are normal. Incidental note is made of a left cerebellar hemisphere developmental venous anomaly. There is no mass lesion or abnormal enhancement. There is no mass effect or midline shift. Vascular: Normal flow voids. Skull and upper cervical spine: Normal marrow signal. Sinuses/Orbits: The paranasal sinuses are clear. The globes and orbits are unremarkable. The optic nerves are unremarkable on these nondedicated sequences. Other: The mastoid air cells and middle ear cavities are clear. IMPRESSION: Normal brain MRI. Electronically Signed   By: Lesia Hausen M.D.   On: 07/01/2023 11:11    Physical Exam: BP 112/76   Pulse 86   Ht 5\' 2"  (1.575 m)   Wt 178 lb (80.7 kg)   LMP 06/07/2023 (Approximate)   SpO2 99%   BMI 32.56 kg/m  Constitutional: Pleasant,well-developed, Caucasian female in no acute distress. HEENT: Normocephalic and atraumatic. Conjunctivae are normal. No scleral icterus. Neck supple.  Cardiovascular: Normal rate, regular rhythm.  Pulmonary/chest: Effort normal and breath sounds normal. No wheezing, rales or rhonchi. Abdominal: Soft, nondistended, nontender. Bowel sounds active throughout. There are no masses palpable. No  hepatomegaly. Extremities: no edema Lymphadenopathy: No cervical adenopathy noted. Neurological: Alert and oriented to person place and time. Skin: Skin is warm and dry. No rashes noted. Psychiatric: Normal mood and affect. Behavior is normal.  CBC    Component Value Date/Time   WBC 8.3 04/06/2023 0943   RBC 4.51 04/06/2023 0943   HGB 13.7 04/06/2023 0943   HGB 12.5 11/26/2016 1043   HCT 40.5 04/06/2023 0943   PLT 304.0 04/06/2023 0943   MCV 89.8 04/06/2023 0943   MCH 30.1 03/19/2020 0816   MCHC 33.7 04/06/2023 0943   RDW 12.8 04/06/2023 0943   LYMPHSABS 2.0 04/06/2023 0943   MONOABS 0.5 04/06/2023 0943   EOSABS 0.2 04/06/2023 0943   BASOSABS 0.0 04/06/2023 0943    CMP     Component Value Date/Time   NA 137 04/06/2023 0943   K 3.8 04/06/2023 0943  CL 105 04/06/2023 0943   CO2 23 04/06/2023 0943   GLUCOSE 97 04/06/2023 0943   BUN 9 04/06/2023 0943   CREATININE 0.78 04/06/2023 0943   CREATININE 0.71 01/10/2021 1057   CALCIUM 9.3 04/06/2023 0943   PROT 7.1 04/06/2023 0943   ALBUMIN 4.1 04/06/2023 0943   AST 26 04/06/2023 0943   ALT 39 (H) 04/06/2023 0943   ALKPHOS 40 04/06/2023 0943   BILITOT 0.4 04/06/2023 0943   GFRNONAA 102 03/19/2020 0816   GFRAA 118 03/19/2020 0816       Latest Ref Rng & Units 04/06/2023    9:43 AM 03/19/2020    8:16 AM 11/26/2016   10:43 AM  CBC EXTENDED  WBC 4.0 - 10.5 K/uL 8.3  9.4    RBC 3.87 - 5.11 Mil/uL 4.51  4.22    Hemoglobin 12.0 - 15.0 g/dL 78.4  69.6  29.5   HCT 36.0 - 46.0 % 40.5  39.0    Platelets 150.0 - 400.0 K/uL 304.0  301    NEUT# 1.4 - 7.7 K/uL 5.5  6,768    Lymph# 0.7 - 4.0 K/uL 2.0  1,607        ASSESSMENT AND PLAN:  32 year old female with longstanding IBS, currently with clinical improvement on a low FODMAP diet.  She does continue to struggle with bloating and abdominal distention.  We discussed the pathophysiology of abdominal bloating and distention to include the role of diet and gut flora.  We also  discussed concept of abdominal phrenic dyssynergia and the role of physical therapy and biofeedback and improving the symptom.  We discussed the [facility] app which was recently approved by the FDA for management of IBS symptoms.  We had previously discussed pelvic floor physical therapy given her difficulty with straining with evacuation.  This has improved somewhat, so we will hold off on this for now.  We discussed medical therapies used in IBS to include TCAs.  She would like to avoid medications if possible.  For now, recommend we just continue the low FODMAP diet and continue to try to eliminate problem foods.  She has experienced success in this realm so far, hopefully her symptoms will continue to improve with dietary adjustments.  I do think that a daily fiber supplement would help improve her stool consistency and regularity.   Will repeat CMP today to see if ALT is persistently elevated.  If so, we will obtain ultrasound to look for fatty change.  IBS -Continue low FODMAP diet -Would consider daily fiber supplement (psyllium) -Hold off on other medical therapies for now -Consider [facility] app as another alternative to medical therapy  Elevated ALT - Repeat CMP  Lynnsey Barbara E. Tomasa Rand, MD [facility]  Karie Georges, MD

## 2023-08-18 ENCOUNTER — Ambulatory Visit: Payer: Managed Care, Other (non HMO) | Admitting: Neurology

## 2023-11-16 NOTE — Progress Notes (Signed)
 NEUROLOGY FOLLOW UP OFFICE NOTE  Felicia Lambert [id number]  Assessment/Plan:   Migraine without aura, without status migrainosus, not intractable Ocular migraines Lightheadedness - does not seem to be neurologic.  Reports remote history of recurrent syncope as a child.  Previous cardiac workup for palpitations was negative. Unclear if vasovagal related to GI upset from IBS or related to her anxiety.  Improved since she modified her diet.   Numbness of hands and feet.  Unclear etiology.  Description vague and not consistent with paresthesias or neuropathy. Transient episode of vision loss, likely ocular migraine   Migraine prevention:  defer for now Migraine rescue:  Sudafed.  Advised to not over use.  Will start a triptan if ineffective Limit use of pain relievers to no more than 2 days out of week to prevent risk of rebound or medication-overuse headache. Keep headache diary Follow up 6 months.   Subjective:  Felicia Lambert is a 32 year old female with irritable bowel syndrome and anxiety who follows up for headaches and visual disturbance.  UPDATE: MRI of brain with and without contrast on 06/26/2023 personally reviewed was normal.  She had a dilated eye exam by Dr. Marcey of ophthalmology on 08/12/2023 which was unremarkable with no evidence of papilledema or other concerning abnormalities.  Headaches are occurring mostly with change in weather, improved during winter. In December, no more than 2 or 3 due to change in weather.  Usually treats with Sudafed.    HISTORY: She had a viral illness in December 2020 presenting as flu-like symptoms with peripheral vision loss.  Unclear if it was COVID because they did not have testing at that time.  Since then, she has had various chronic symptomatology.   She has had migraines all of her life but started having increased headaches.  It is a pressure/throbbing involving various locations on her head, but typically back of head (unilateral or  bilateral) when severe.  Unsure if it is positional.  Associated with photophobia and phonophobia but not nausea.  Lasts 1 to several hours.  Treats with ibuprofen about once a week.  Occurs 3 to 5 days a week.  Needs to take a nap.  Environmental allergies, heat and anxiety are triggers.  She has had episodes of dizziness, described as lightheadedness, not spinning.  It occurs spontaneously.  She will feel hot, diaphoretic, palpitations, chest tightness, tunnel vision and sensation that she is going to pass out but doesn't.  Lasts several hours.  Used to occur frequently.  She was diagnosed with IBS and started on a low FODMAP diet.  These episodes have dramatically decreased.  On a few occasions, she has had visual symptoms described as seeing TV static in her peripheral vision lasting 20-30 minutes.  No associated headache.  One time, she had blackout of central vision in both eyes that lasted an hour.  Last eye exam was two years ago.    She reports episodes of a sensory disturbance in her hands and feet.  She describes it as an emptiness.  It is not numb, tingling, or burning.  It usually occurs at work when she has been sitting at her desk on the computer.  Will get up and move around.  Resolves after 30 minutes.  Frequency variable - may occur daily for a week and then 2 weeks without episode.     Prior blood work has been unremarkable, including CBC, CMP, TSH 0.68, vit D 37.22, B12 323, folate 10.5, Hgb A1c 5.1, negative HIV,  negative RPR  She has longstanding history of anxiety.  She reports that she had passed out on several occasions as a child into her teens.  Thought to be due to low blood sugar.  Never saw the doctor.  She was previously evaluated by cardiology for palpitations.  Workup reportedly negative.    She reports that she had hit her head on several occasions as a child.    PAST MEDICAL HISTORY: Past Medical History:  Diagnosis Date   COVID-19    02/2020   Hemorrhoids     IBS (irritable bowel syndrome)     MEDICATIONS: Current Outpatient Medications on File Prior to Visit  Medication Sig Dispense Refill   BIOTIN PO Take by mouth daily.     EPINEPHrine  0.3 mg/0.3 mL IJ SOAJ injection Inject 0.3 mg into the muscle as needed for anaphylaxis. 1 each 0   hydrocortisone  (ANUSOL -HC) 25 MG suppository Place 1 suppository (25 mg total) rectally 2 (two) times daily. For 7 days (Patient not taking: Reported on 07/06/2023) 14 suppository 3   loratadine (CLARITIN) 10 MG tablet Take 10 mg by mouth daily.     Norethindrone Acetate-Ethinyl Estrad-FE (LOESTRIN 24 FE) 1-20 MG-MCG(24) tablet Take 1 tablet by mouth daily. 84 tablet 4   polyethylene glycol powder (GLYCOLAX /MIRALAX ) 17 GM/SCOOP powder Take 17 g by mouth 2 (two) times daily as needed. (Patient taking differently: Take 17 g by mouth as needed.) 225 g 1   Probiotic Product (PROBIOTIC PO) Take by mouth.     SALINE NASAL SPRAY NA as needed.     VITAMIN D  PO Take 50 mg by mouth daily.     No current facility-administered medications on file prior to visit.    ALLERGIES: Allergies  Allergen Reactions   Peanut-Containing Drug Products Anaphylaxis   Amoxicillin     GI issues   Apple Juice    Avocado    Cefdinir      Dizzy,flushed   Seasonal Ic [Cholestatin]    Watermelon Flavoring Agent (Non-Screening)     FAMILY HISTORY: Family History  Problem Relation Age of Onset   Neuropathy Father    Stroke Father    Thyroid  disease Father    Pulmonary embolism Father        thought related to Covid diagnosis   Deep vein thrombosis Father    Factor V Leiden deficiency Father        prothrombin gene mutation   CVA Father    Hypothyroidism Paternal Aunt    Hypothyroidism Paternal Grandfather    Colon cancer Neg Hx    Esophageal cancer Neg Hx    Stomach cancer Neg Hx       Objective:  Blood pressure 127/79, pulse 98, height 5' 2 (1.575 m), weight 173 lb (78.5 kg), SpO2 99%. General: No acute distress.   Patient appears well-groomed.     Juliene Dunnings, DO  CC: Heron Sharper, MD

## 2023-11-18 ENCOUNTER — Ambulatory Visit: Payer: 59 | Admitting: Neurology

## 2023-11-18 ENCOUNTER — Encounter: Payer: Self-pay | Admitting: Neurology

## 2023-11-18 VITALS — BP 127/79 | HR 98 | Ht 62.0 in | Wt 173.0 lb

## 2023-11-18 DIAGNOSIS — G43109 Migraine with aura, not intractable, without status migrainosus: Secondary | ICD-10-CM | POA: Diagnosis not present

## 2023-11-18 DIAGNOSIS — G43009 Migraine without aura, not intractable, without status migrainosus: Secondary | ICD-10-CM | POA: Diagnosis not present

## 2023-11-18 NOTE — Patient Instructions (Signed)

## 2023-11-26 ENCOUNTER — Other Ambulatory Visit: Payer: Self-pay

## 2023-11-26 DIAGNOSIS — Z3041 Encounter for surveillance of contraceptive pills: Secondary | ICD-10-CM

## 2023-11-26 MED ORDER — NORETHIN ACE-ETH ESTRAD-FE 1-20 MG-MCG(24) PO TABS: 1.0000 | ORAL_TABLET | Freq: Every day | ORAL | 0 refills | Status: DC

## 2023-11-26 NOTE — Telephone Encounter (Signed)
 Pt LVM in triage line stating that she just made appt for AEX and may need an additional refill of meds prior to her appt.

## 2023-11-26 NOTE — Telephone Encounter (Signed)
 Pt made appt with EB 02/07/24 for AEX. Requesting OCP refills

## 2024-01-31 ENCOUNTER — Encounter: Payer: Self-pay | Admitting: Obstetrics and Gynecology

## 2024-02-07 ENCOUNTER — Ambulatory Visit (INDEPENDENT_AMBULATORY_CARE_PROVIDER_SITE_OTHER): Payer: Managed Care, Other (non HMO) | Admitting: Obstetrics and Gynecology

## 2024-02-07 ENCOUNTER — Encounter: Payer: Self-pay | Admitting: Obstetrics and Gynecology

## 2024-02-07 ENCOUNTER — Other Ambulatory Visit (HOSPITAL_COMMUNITY)
Admission: RE | Admit: 2024-02-07 | Discharge: 2024-02-07 | Disposition: A | Discharge status: Home / Self Care | Source: Ambulatory Visit | Attending: Obstetrics and Gynecology | Admitting: Obstetrics and Gynecology

## 2024-02-07 VITALS — BP 112/82 | HR 102 | Ht 62.0 in | Wt 172.0 lb

## 2024-02-07 DIAGNOSIS — Z1331 Encounter for screening for depression: Secondary | ICD-10-CM

## 2024-02-07 DIAGNOSIS — Z01419 Encounter for gynecological examination (general) (routine) without abnormal findings: Secondary | ICD-10-CM | POA: Diagnosis not present

## 2024-02-07 DIAGNOSIS — L700 Acne vulgaris: Secondary | ICD-10-CM | POA: Diagnosis not present

## 2024-02-07 DIAGNOSIS — Z3041 Encounter for surveillance of contraceptive pills: Secondary | ICD-10-CM | POA: Diagnosis not present

## 2024-02-07 DIAGNOSIS — Z23 Encounter for immunization: Secondary | ICD-10-CM | POA: Diagnosis not present

## 2024-02-07 MED ORDER — DOXYCYCLINE HYCLATE 100 MG PO CAPS: 100.0000 mg | ORAL_CAPSULE | Freq: Two times a day (BID) | ORAL | 0 refills | Status: AC

## 2024-02-07 MED ORDER — NORETHIN ACE-ETH ESTRAD-FE 1-20 MG-MCG(24) PO TABS: 1.0000 | ORAL_TABLET | Freq: Every day | ORAL | 0 refills | Status: DC

## 2024-02-07 NOTE — Addendum Note (Signed)
 Addended by: Jodelle Red D on: 02/07/2024 08:33 AM   Modules accepted: Orders

## 2024-02-07 NOTE — Progress Notes (Signed)
 33 y.o. y.o. female here for annual exam. Patient's last menstrual period was 01/19/2024 (approximate). Period Cycle (Days): 28 Period Duration (Days): 4 Menstrual Flow: Moderate Menstrual Control: Maxi pad Dysmenorrhea: (!) Moderate (varies by month; mild to severe) Dysmenorrhea Symptoms: Cramping, Nausea, Diarrhea, Headache (can vary by month) Not bothered by her cycle G1P0A1 Single.  Works in [employer] for a [employer].     HPI: Well on the generic of LoEstrin Fe 24 1/20.  No BTB.  No pelvic pain.  Not currently sexually active.  Last Pap Neg in 01/2022.  Pap reflex today.  Breasts normal.  Urine normal.  Gastro-intestinal issues with diarrhea and constipation.  Changed diet and improved per patient.  Internal hemorrhoids per patient.  BMI 32.66.  Stationary bike and Yoga.  Cystic acne on chest-referral to dermatology. Body mass index is 31.46 kg/m.     02/07/2024    8:02 AM 12/16/2022    8:45 AM 03/18/2020    1:40 PM  Depression screen PHQ 2/9  Decreased Interest 0 1 0  Down, Depressed, Hopeless 0 0 0  PHQ - 2 Score 0 1 0  Altered sleeping  0   Tired, decreased energy  1   Change in appetite  0   Feeling bad or failure about yourself   0   Trouble concentrating  0   Moving slowly or fidgety/restless  0   Suicidal thoughts  0   PHQ-9 Score  2   Difficult doing work/chores  Not difficult at all     Blood pressure 112/82, pulse (!) 102, height 5\' 2"  (1.575 m), weight 172 lb (78 kg), last menstrual period 01/19/2024, SpO2 98%.     Component Value Date/Time   DIAGPAP  02/03/2023 1626    - Negative for intraepithelial lesion or malignancy (NILM)   DIAGPAP  01/21/2022 0904    - Negative for intraepithelial lesion or malignancy (NILM)   DIAGPAP  12/20/2018 0000    NEGATIVE FOR INTRAEPITHELIAL LESIONS OR MALIGNANCY.   ADEQPAP  02/03/2023 1626    Satisfactory for evaluation; transformation zone component PRESENT.   ADEQPAP  01/21/2022 0904    Satisfactory for  evaluation; transformation zone component PRESENT.   ADEQPAP  12/20/2018 0000    Satisfactory for evaluation  endocervical/transformation zone component PRESENT.    GYN HISTORY:    Component Value Date/Time   DIAGPAP  02/03/2023 1626    - Negative for intraepithelial lesion or malignancy (NILM)   DIAGPAP  01/21/2022 0904    - Negative for intraepithelial lesion or malignancy (NILM)   DIAGPAP  12/20/2018 0000    NEGATIVE FOR INTRAEPITHELIAL LESIONS OR MALIGNANCY.   ADEQPAP  02/03/2023 1626    Satisfactory for evaluation; transformation zone component PRESENT.   ADEQPAP  01/21/2022 0904    Satisfactory for evaluation; transformation zone component PRESENT.   ADEQPAP  12/20/2018 0000    Satisfactory for evaluation  endocervical/transformation zone component PRESENT.    OB History  Gravida Para Term Preterm AB Living  1 0 0 0 1 0  SAB IAB Ectopic Multiple Live Births  0 1 0 0 0    # Outcome Date GA Lbr Len/2nd Weight Sex Type Anes PTL Lv  1 IAB             Past Medical History:  Diagnosis Date   COVID-19    02/2020   Hemorrhoids    IBS (irritable bowel syndrome)     Past Surgical History:  Procedure Laterality Date  TONSILLECTOMY  2014   WISDOM TOOTH EXTRACTION  2011    Current Outpatient Medications on File Prior to Visit  Medication Sig Dispense Refill   BIOTIN PO Take by mouth daily.     EPINEPHrine 0.3 mg/0.3 mL IJ SOAJ injection Inject 0.3 mg into the muscle as needed for anaphylaxis. 1 each 0   hydrocortisone (ANUSOL-HC) 25 MG suppository Place 1 suppository (25 mg total) rectally 2 (two) times daily. For 7 days 14 suppository 3   loratadine (CLARITIN) 10 MG tablet Take 10 mg by mouth daily.     Norethindrone Acetate-Ethinyl Estrad-FE (LOESTRIN 24 FE) 1-20 MG-MCG(24) tablet Take 1 tablet by mouth daily. 84 tablet 0   polyethylene glycol powder (GLYCOLAX/MIRALAX) 17 GM/SCOOP powder Take 17 g by mouth 2 (two) times daily as needed. (Patient taking differently:  Take 17 g by mouth as needed.) 225 g 1   Probiotic Product (PROBIOTIC PO) Take by mouth.     SALINE NASAL SPRAY NA as needed.     VITAMIN D PO Take 50 mg by mouth daily.     No current facility-administered medications on file prior to visit.    Social History   Socioeconomic History   Marital status: Single    Spouse name: Not on file   Number of children: 0   Years of education: College   Highest education level: Bachelor's degree (e.g., BA, AB, BS)  Occupational History   Occupation: [employer]  Tobacco Use   Smoking status: Never   Smokeless tobacco: Never  Vaping Use   Vaping status: Never Used  Substance and Sexual Activity   Alcohol use: Yes    Comment: occ   Drug use: No   Sexual activity: Not Currently    Partners: Male    Birth control/protection: OCP    Comment: 1ST INTERCOURSE- 22, PARTNERS- more than 5  Other Topics Concern   Not on file  Social History Narrative   Are you right handed or left handed? Right handed   Are you currently employed ? Y    What is your current occupation? [employer]   Do you live at home alone? Y    Who lives with you?    What type of home do you live in: 1 story or 2 story? 2          Social Drivers of Corporate investment banker Strain: Not on file  Food Insecurity: Not on file  Transportation Needs: Not on file  Physical Activity: Not on file  Stress: Not on file  Social Connections: Not on file  Intimate Partner Violence: Not on file    Family History  Problem Relation Age of Onset   Neuropathy Father    Stroke Father    Thyroid disease Father    Pulmonary embolism Father        thought related to Covid diagnosis   Deep vein thrombosis Father    Factor V Leiden deficiency Father        prothrombin gene mutation   CVA Father    Hypothyroidism Paternal Aunt    Hypothyroidism Paternal Grandfather    Colon cancer Neg Hx    Esophageal cancer Neg Hx    Stomach cancer Neg Hx      Allergies  Allergen  Reactions   Peanut-Containing Drug Products Anaphylaxis   Almond (Diagnostic)    Amoxicillin     GI issues   Apple    Avocado    Cefdinir  Dizzy,flushed   Seasonal Ic [Cholestatin]    Watermelon [Citrullus Vulgaris]       Patient's last menstrual period was Patient's last menstrual period was 01/19/2024 (approximate)..           Review of Systems Alls systems reviewed and are negative.     Physical Exam Constitutional:      Appearance: Normal appearance.    Genitourinary:     Vulva and urethral meatus normal.     No lesions in the vagina.     Right Labia: No rash, lesions or skin changes.    Left Labia: No lesions, skin changes or rash.    No vaginal discharge or tenderness.     No vaginal prolapse present.    No vaginal atrophy present.     Right Adnexa: not tender, not palpable and no mass present.    Left Adnexa: not tender, not palpable and no mass present.    No cervical motion tenderness or discharge.     Uterus is not enlarged, tender or irregular.  Breasts:    Right: Normal.     Left: Normal.  HENT:     Head: Normocephalic.  Neck:     Thyroid: No thyroid mass, thyromegaly or thyroid tenderness.  Cardiovascular:     Rate and Rhythm: Normal rate and regular rhythm.     Heart sounds: Normal heart sounds, S1 normal and S2 normal.  Pulmonary:     Effort: Pulmonary effort is normal.     Breath sounds: Normal breath sounds and air entry.  Abdominal:     General: There is no distension.     Palpations: Abdomen is soft. There is no mass.     Tenderness: There is no abdominal tenderness. There is no guarding or rebound.  Musculoskeletal:        General: Normal range of motion.     Cervical back: Full passive range of motion without pain, normal range of motion and neck supple. No tenderness.     Right lower leg: No edema.     Left lower leg: No edema.  Neurological:     Mental Status: She is alert.  Skin:    General: Skin is warm.  Psychiatric:         Mood and Affect: Mood normal.        Behavior: Behavior normal.        Thought Content: Thought content normal.  Vitals and nursing note reviewed. Exam conducted with a chaperone present.       A:         Well Woman GYN exam                             P:        Pap smear collected today Encouraged annual mammogram screening Colon cancer screening not indicated Refilled ocp's today Tdap given today Labs and immunizations to do with PMD Discussed breast self exams Encouraged healthy lifestyle practices To begin course of doxycyline for rash on chest. Referral to dermatology No follow-ups on file.  Earley Favor

## 2024-02-09 ENCOUNTER — Encounter: Payer: Self-pay | Admitting: Obstetrics and Gynecology

## 2024-02-09 ENCOUNTER — Other Ambulatory Visit: Payer: Self-pay

## 2024-02-09 LAB — CYTOLOGY - PAP
Comment: NEGATIVE
Diagnosis: UNDETERMINED — AB
High risk HPV: NEGATIVE

## 2024-02-09 MED ORDER — FLUCONAZOLE 150 MG PO TABS
150.0000 mg | ORAL_TABLET | Freq: Once | ORAL | 0 refills | Status: AC
Start: 2024-02-09 — End: 2024-02-09

## 2024-02-09 MED ORDER — METRONIDAZOLE 500 MG PO TABS: 500.0000 mg | ORAL_TABLET | Freq: Two times a day (BID) | ORAL | 0 refills | Status: DC

## 2024-02-12 ENCOUNTER — Other Ambulatory Visit: Payer: Self-pay | Admitting: Obstetrics & Gynecology

## 2024-02-12 DIAGNOSIS — Z3041 Encounter for surveillance of contraceptive pills: Secondary | ICD-10-CM

## 2024-02-15 NOTE — Telephone Encounter (Signed)
 Rx just sent on 02/07/2024 for #84 with zero refills to the same pharmacy.   Rx denied for now.

## 2024-05-17 ENCOUNTER — Ambulatory Visit: Admitting: Family Medicine

## 2024-05-17 ENCOUNTER — Ambulatory Visit: Payer: Self-pay

## 2024-05-17 ENCOUNTER — Ambulatory Visit

## 2024-05-17 VITALS — BP 130/82 | HR 94 | Temp 98.5°F | Ht 62.0 in | Wt 171.5 lb

## 2024-05-17 DIAGNOSIS — R1011 Right upper quadrant pain: Secondary | ICD-10-CM

## 2024-05-17 DIAGNOSIS — R631 Polydipsia: Secondary | ICD-10-CM

## 2024-05-17 LAB — CBC WITH DIFFERENTIAL/PLATELET
Basophils Absolute: 0 10*3/uL (ref 0.0–0.1)
Basophils Relative: 0.7 % (ref 0.0–3.0)
Eosinophils Absolute: 0.1 10*3/uL (ref 0.0–0.7)
Eosinophils Relative: 1.3 % (ref 0.0–5.0)
HCT: 39.7 % (ref 36.0–46.0)
Hemoglobin: 13.2 g/dL (ref 12.0–15.0)
Lymphocytes Relative: 27.7 % (ref 12.0–46.0)
Lymphs Abs: 1.9 10*3/uL (ref 0.7–4.0)
MCHC: 33.2 g/dL (ref 30.0–36.0)
MCV: 88.1 fl (ref 78.0–100.0)
Monocytes Absolute: 0.5 10*3/uL (ref 0.1–1.0)
Monocytes Relative: 7.9 % (ref 3.0–12.0)
Neutro Abs: 4.2 10*3/uL (ref 1.4–7.7)
Neutrophils Relative %: 62.4 % (ref 43.0–77.0)
Platelets: 285 10*3/uL (ref 150.0–400.0)
RBC: 4.5 Mil/uL (ref 3.87–5.11)
RDW: 13.2 % (ref 11.5–15.5)
WBC: 6.8 10*3/uL (ref 4.0–10.5)

## 2024-05-17 LAB — COMPREHENSIVE METABOLIC PANEL WITH GFR
ALT: 24 U/L (ref 0–35)
AST: 20 U/L (ref 0–37)
Albumin: 4.6 g/dL (ref 3.5–5.2)
Alkaline Phosphatase: 40 U/L (ref 39–117)
BUN: 10 mg/dL (ref 6–23)
CO2: 26 meq/L (ref 19–32)
Calcium: 9.7 mg/dL (ref 8.4–10.5)
Chloride: 105 meq/L (ref 96–112)
Creatinine, Ser: 0.79 mg/dL (ref 0.40–1.20)
GFR: 100.27 mL/min (ref >60.00)
Glucose, Bld: 116 mg/dL — ABNORMAL HIGH (ref 70–99)
Potassium: 4.3 meq/L (ref 3.5–5.1)
Sodium: 138 meq/L (ref 135–145)
Total Bilirubin: 0.4 mg/dL (ref 0.2–1.2)
Total Protein: 7.4 g/dL (ref 6.0–8.3)

## 2024-05-17 LAB — HEMOGLOBIN A1C: Hgb A1c MFr Bld: 5.3 % (ref 4.6–6.5)

## 2024-05-17 NOTE — Telephone Encounter (Signed)
  FYI Only or Action Required?: FYI only for provider.  Patient was last seen in primary care on 04/06/2023 by Ozell Heron HERO, MD. Called Nurse Triage reporting Abdominal Pain. Symptoms began several days ago. Interventions attempted: Dietary changes. Symptoms are: unchanged.  Triage Disposition: See Physician Within 24 Hours  Patient/caregiver understands and will follow disposition?: Yes  Pt had scheduled appt via Mychart for 7/8. Offered appt today at 1030 with PCP. Pt going to call work and see if she can come in, advised I would keep 7/8 appt in case she is unable to make it.   Copied from CRM [id number]. Topic: Clinical - Red Word Triage >> May 17, 2024  8:39 AM Felicia Lambert wrote: Red Word that prompted transfer to Nurse Triage: Pain on right side, very painful when it comes / dry mouth Reason for Disposition  [1] MODERATE pain (e.g., interferes with normal activities) AND [2] pain comes and goes (cramps) AND [3] present > 24 hours  (Exception: Pain with Vomiting or Diarrhea - see that Guideline.)  Answer Assessment - Initial Assessment Questions 1. LOCATION: Where does it hurt?      Middle on R side  3. ONSET: When did the pain begin? (e.g., minutes, hours or days ago)      Sunday 5. PATTERN Does the pain come and go, or is it constant?    - If it comes and goes: How long does it last? Do you have pain now?     (Note: Comes and goes means the pain is intermittent. It goes away completely between bouts.)    - If constant: Is it getting better, staying the same, or getting worse?      (Note: Constant means the pain never goes away completely; most serious pain is constant and gets worse.)      Come and goes  6. SEVERITY: How bad is the pain?  (e.g., Scale 1-10; mild, moderate, or severe)    - MILD (1-3): Doesn't interfere with normal activities, abdomen soft and not tender to touch.     - MODERATE (4-7): Interferes with normal activities or awakens from sleep, abdomen  tender to touch.     - SEVERE (8-10): Excruciating pain, doubled over, unable to do any normal activities.       5-6/10 8. CAUSE: What do you think is causing the stomach pain?     Pt thinks possible gallbladder  10. OTHER SYMPTOMS: Do you have any other symptoms? (e.g., back pain, diarrhea, fever, urination pain, vomiting)       No  Has increased water intake  Protocols used: Abdominal Pain - Female-A-AH

## 2024-05-17 NOTE — Progress Notes (Signed)
   Acute Office Visit  Subjective:     Patient ID: Felicia Lambert, female    DOB: [birth year], 33 y.o.   MRN: [id number]  Chief Complaint  Patient presents with   Abdominal Pain    Patient complains of right sided abdominal pain x2-3 days, history of IBS and states this feels different     Pt is reporting some right sided abdominal pain for about 2-3 days. She reports a history of IBS in the past, states that she has struggled to get enough fiber in her diet. Thought that she ate to much wine or ice cream over the weekend and maybe this pain is related. States that she took magnesium citrate yesterday and had a BM yesterday evening.  No fever or chills, no nausea or vomiting. No difficulty urinating, no changes in periods. The pain is located in the right upper quadrant.  States she is feeling more dehydrated lately, more thirsty.      Review of Systems  All other systems reviewed and are negative.       Objective:    BP 130/82   Pulse 94   Temp 98.5 F (36.9 C) (Oral)   Ht 5' 2 (1.575 m)   Wt 171 lb 8 oz (77.8 kg)   LMP 05/10/2024 (Exact Date)   SpO2 98%   BMI 31.37 kg/m    Physical Exam Vitals reviewed.  Constitutional:      Appearance: Normal appearance. She is well-developed, well-groomed and overweight.  Eyes:     Conjunctiva/sclera: Conjunctivae normal.  Cardiovascular:     Rate and Rhythm: Normal rate and regular rhythm.     Heart sounds: S1 normal and S2 normal.  Pulmonary:     Effort: Pulmonary effort is normal.     Breath sounds: Normal breath sounds and air entry.  Abdominal:     General: Bowel sounds are normal.     Tenderness: There is no abdominal tenderness. Negative signs include Murphy's sign and McBurney's sign.  Musculoskeletal:     Right lower leg: No edema.     Left lower leg: No edema.  Neurological:     Mental Status: She is alert and oriented to person, place, and time. Mental status is at baseline.     Gait: Gait is intact.  Psychiatric:         Mood and Affect: Mood and affect normal.        Speech: Speech normal.        Behavior: Behavior normal.        Judgment: Judgment normal.     No results found for any visits on 05/17/24.      Assessment & Plan:   Problem List Items Addressed This Visit   None Visit Diagnoses       RUQ abdominal pain    -  Primary   Relevant Orders   DG Abd 2 Views   CMP     Polydipsia       Relevant Orders   Hemoglobin A1c     Will check liver function, bilirubin to rule out acute GB issues. Checking ABD film to look at the stool burden/ bowel gas pattern. We discussed the we may need ot get an US  of the GB if her symptoms persist  No orders of the defined types were placed in this encounter.   No follow-ups on file.  Heron CHRISTELLA Sharper, MD

## 2024-05-18 ENCOUNTER — Ambulatory Visit: Payer: 59 | Admitting: Neurology

## 2024-05-22 ENCOUNTER — Ambulatory Visit: Payer: Self-pay | Admitting: Family Medicine

## 2024-05-23 ENCOUNTER — Ambulatory Visit: Admitting: Family Medicine

## 2024-06-01 ENCOUNTER — Other Ambulatory Visit: Payer: Self-pay | Admitting: Obstetrics and Gynecology

## 2024-06-01 DIAGNOSIS — Z3041 Encounter for surveillance of contraceptive pills: Secondary | ICD-10-CM

## 2024-06-01 NOTE — Telephone Encounter (Signed)
 Medication refill request: aurovela [id number] 1-20 Last AEX:  02-07-24 Next AEX: 02-08-25 Last MMG (if hormonal medication request): none Refill authorized: Please approve if appropriate

## 2024-07-07 NOTE — Progress Notes (Signed)
===  View-only below this line=== ----- Message ----- From: Stacia Glendia BRAVO, MD Sent: 07/07/2024   6:02 AM EDT To: Naomie LOISE Sharps, RN  Liver enzymes remain normal.  Can follow up as needed with PCP with routine labs.  No further liver work up recommended ----- Message ----- From: Sharps Naomie LOISE, RN Sent: 07/05/2024   9:56 AM EDT To: Glendia BRAVO Stacia, MD  Dr JAYSON-  You requested patient have repeat lfts in year from 07/06/23 lab; patient had CMP by another provider 05/17/24. Please review and advise of any additional recommendations you may have.

## 2024-10-24 ENCOUNTER — Encounter: Payer: Self-pay | Admitting: Dermatology

## 2024-10-24 ENCOUNTER — Ambulatory Visit (INDEPENDENT_AMBULATORY_CARE_PROVIDER_SITE_OTHER): Admitting: Dermatology

## 2024-10-24 VITALS — BP 132/84 | HR 93

## 2024-10-24 DIAGNOSIS — D2361 Other benign neoplasm of skin of right upper limb, including shoulder: Secondary | ICD-10-CM

## 2024-10-24 DIAGNOSIS — D489 Neoplasm of uncertain behavior, unspecified: Secondary | ICD-10-CM | POA: Diagnosis not present

## 2024-10-24 DIAGNOSIS — W908XXA Exposure to other nonionizing radiation, initial encounter: Secondary | ICD-10-CM

## 2024-10-24 DIAGNOSIS — D225 Melanocytic nevi of trunk: Secondary | ICD-10-CM | POA: Diagnosis not present

## 2024-10-24 DIAGNOSIS — L821 Other seborrheic keratosis: Secondary | ICD-10-CM | POA: Diagnosis not present

## 2024-10-24 DIAGNOSIS — D235 Other benign neoplasm of skin of trunk: Secondary | ICD-10-CM | POA: Diagnosis not present

## 2024-10-24 DIAGNOSIS — L814 Other melanin hyperpigmentation: Secondary | ICD-10-CM

## 2024-10-24 DIAGNOSIS — Z1283 Encounter for screening for malignant neoplasm of skin: Secondary | ICD-10-CM | POA: Diagnosis not present

## 2024-10-24 DIAGNOSIS — D1801 Hemangioma of skin and subcutaneous tissue: Secondary | ICD-10-CM

## 2024-10-24 DIAGNOSIS — L578 Other skin changes due to chronic exposure to nonionizing radiation: Secondary | ICD-10-CM | POA: Diagnosis not present

## 2024-10-24 DIAGNOSIS — D229 Melanocytic nevi, unspecified: Secondary | ICD-10-CM

## 2024-10-24 NOTE — Progress Notes (Addendum)
 "  New Patient Visit   Subjective  Felicia Lambert is a 33 y.o. female who presents for the following:  Total Body Skin Exam (TBSE)  Patient present today for new patient visit for TBSE. The patient reports she has spots, moles and lesions to be evaluated, some may be new or changing and the patient may have concern these could be cancer.  Patient has not previously been treated by a dermatologist. Patient reports she does not have hx of bx.  Patient admits to family history of skin cancers - Paternal grandfather.  Patient reports throughout her lifetime has had moderate sun exposure. Currently, patient reports if she has excessive sun exposure, she does apply sunscreen and/or wears protective coverings.  Specific spots of patient concern located on lower back, chest, abdomen and buttock  The following portions of the chart were reviewed this encounter and updated as appropriate: medications, allergies, medical history  Review of Systems:  No other skin or systemic complaints except as noted in HPI or Assessment and Plan.  Objective  Well appearing patient in no apparent distress; mood and affect are within normal limits.  A full examination was performed including scalp, head, eyes, ears, nose, lips, neck, chest, axillae, abdomen, back, buttocks, bilateral upper extremities, bilateral lower extremities, hands, feet, fingers, toes, fingernails, and toenails. All findings within normal limits unless otherwise noted below.   Relevant exam findings are noted in the Assessment and Plan.  Left Upper Back 3mm irregular dark brown macule Right Upper Back 4 mm irregular dark brown macule  Assessment & Plan   LENTIGINES, SEBORRHEIC KERATOSES, HEMANGIOMAS - Benign normal skin lesions - Benign-appearing - Call for any changes  MELANOCYTIC NEVI - Tan-brown and/or pink-flesh-colored symmetric macules and papules - Benign appearing on exam today - Observation - Call clinic for new or changing  moles - Recommend daily use of broad spectrum spf 30+ sunscreen to sun-exposed areas.   ACTINIC DAMAGE - Chronic condition, secondary to cumulative UV/sun exposure - diffuse scaly erythematous macules with underlying dyspigmentation - Recommend daily broad spectrum sunscreen SPF 30+ to sun-exposed areas, reapply every 2 hours as needed.  - Staying in the shade or wearing long sleeves, sun glasses (UVA+UVB protection) and wide brim hats (4-inch brim around the entire circumference of the hat) are also recommended for sun protection.  - Call for new or changing lesions.  DERMATOFIBROMA Exam: Firm pink/brown papulenodule with dimple sign. Located on right arm  Treatment Plan: A dermatofibroma is a benign growth possibly related to trauma, such as an insect bite, cut from shaving, or inflamed acne-type bump. Since benign-appearing and not bothersome, will observe for now.   SKIN CANCER SCREENING PERFORMED TODAY  A) left upper back 3mm irr dk brown mac B) right upper back 4 mm irr dk brown mc  Assessment & Plan   Neoplasm of uncertain behavior (2) (Primary) Left Upper Back  Epidermal / dermal shaving  Lesion diameter (cm):  0.3 Informed consent: discussed and consent obtained   Timeout: patient name, date of birth, surgical site, and procedure verified   Procedure prep:  Patient was prepped and draped in usual sterile fashion Prep type:  Isopropyl alcohol Anesthesia: the lesion was anesthetized in a standard fashion   Anesthetic:  1% lidocaine w/ epinephrine  1-100,000 local infiltration Instrument used: DermaBlade   Hemostasis achieved with: aluminum chloride   Outcome: patient tolerated procedure well   Post-procedure details: sterile dressing applied and wound care instructions given   Dressing type: bandage  Specimen A - Surgical pathology Differential Diagnosis: r/o DN  Check Margins: No  Right Upper Back  Epidermal / dermal shaving  Lesion diameter (cm):   0.4 Informed consent: discussed and consent obtained   Timeout: patient name, date of birth, surgical site, and procedure verified   Procedure prep:  Patient was prepped and draped in usual sterile fashion Prep type:  Isopropyl alcohol Anesthesia: the lesion was anesthetized in a standard fashion   Anesthetic:  1% lidocaine w/ epinephrine  1-100,000 local infiltration Instrument used: DermaBlade   Hemostasis achieved with: aluminum chloride   Outcome: patient tolerated procedure well   Post-procedure details: sterile dressing applied   Dressing type: bandage    Specimen B - Surgical pathology Differential Diagnosis: r/o DN  Check Margins: No  Skin exam for malignant neoplasm  Multiple benign melanocytic nevi  Cherry angioma  Lentigines  Seborrheic keratosis  Actinic skin damage    NEOPLASM OF UNCERTAIN BEHAVIOR (2) Left Upper Back - Epidermal / dermal shaving  Lesion diameter (cm):  0.3 Informed consent: discussed and consent obtained   Timeout: patient name, date of birth, surgical site, and procedure verified   Procedure prep:  Patient was prepped and draped in usual sterile fashion Prep type:  Isopropyl alcohol Anesthesia: the lesion was anesthetized in a standard fashion   Anesthetic:  1% lidocaine w/ epinephrine  1-100,000 local infiltration Instrument used: DermaBlade   Hemostasis achieved with: aluminum chloride   Outcome: patient tolerated procedure well   Post-procedure details: sterile dressing applied and wound care instructions given   Dressing type: bandage    Specimen A - Surgical pathology Differential Diagnosis: r/o DN  Check Margins: No Right Upper Back - Epidermal / dermal shaving  Lesion diameter (cm):  0.4 Informed consent: discussed and consent obtained   Timeout: patient name, date of birth, surgical site, and procedure verified   Procedure prep:  Patient was prepped and draped in usual sterile fashion Prep type:  Isopropyl  alcohol Anesthesia: the lesion was anesthetized in a standard fashion   Anesthetic:  1% lidocaine w/ epinephrine  1-100,000 local infiltration Instrument used: DermaBlade   Hemostasis achieved with: aluminum chloride   Outcome: patient tolerated procedure well   Post-procedure details: sterile dressing applied   Dressing type: bandage    Specimen B - Surgical pathology Differential Diagnosis: r/o DN  Check Margins: No SKIN EXAM FOR MALIGNANT NEOPLASM   MULTIPLE BENIGN MELANOCYTIC NEVI   CHERRY ANGIOMA   LENTIGINES   SEBORRHEIC KERATOSIS   ACTINIC SKIN DAMAGE    Return in about 1 year (around 10/24/2025) for FBSE F/U.   Documentation: I have reviewed the above documentation for accuracy and completeness, and I agree with the above.  Delon Lenis, DO      "

## 2024-10-24 NOTE — Patient Instructions (Addendum)

## 2024-10-25 LAB — SURGICAL PATHOLOGY

## 2024-10-30 ENCOUNTER — Encounter: Payer: Self-pay | Admitting: Obstetrics and Gynecology

## 2024-10-30 ENCOUNTER — Encounter: Payer: Self-pay | Admitting: Neurology

## 2024-10-30 ENCOUNTER — Ambulatory Visit: Payer: Self-pay | Admitting: Dermatology

## 2024-10-30 ENCOUNTER — Ambulatory Visit: Payer: Self-pay | Admitting: Neurology

## 2024-10-30 VITALS — BP 135/88 | HR 98 | Ht 62.0 in | Wt 171.6 lb

## 2024-10-30 DIAGNOSIS — D239 Other benign neoplasm of skin, unspecified: Secondary | ICD-10-CM | POA: Insufficient documentation

## 2024-10-30 DIAGNOSIS — G43809 Other migraine, not intractable, without status migrainosus: Secondary | ICD-10-CM | POA: Diagnosis not present

## 2024-10-30 HISTORY — DX: Other benign neoplasm of skin, unspecified: D23.9

## 2024-10-30 MED ORDER — PROPRANOLOL HCL 10 MG PO TABS: 10.0000 mg | ORAL_TABLET | Freq: Three times a day (TID) | ORAL | 5 refills | Status: AC | PRN

## 2024-10-30 NOTE — Patient Instructions (Signed)
 Start propranolol  10mg  as needed for dizzy spells/anxiety attack.  May take up to 3 times daily as needed Discuss change in birth control with gynecologist.

## 2024-10-30 NOTE — Progress Notes (Signed)
 Hi Shirron,  Please call pt and notify that their bx results showed an abnormal mole that requires a full excision in office with Dr Corey   1. Skin, left upper back :  Observe for regrowth during FBSE      DYSPLASTIC JUNCTIONAL LENTIGINOUS NEVUS WITH MODERATE ATYPIA, PERIPHERAL MARGIN       INVOLVED        2. Skin, right upper back : Wide excision with Dr SHAUNNA      DYSPLASTIC NEVUS WITH MODERATE TO SEVERE ATYPIA, PERIPHERAL MARGIN INVOLVED, SEE

## 2024-10-30 NOTE — Progress Notes (Signed)
 NEUROLOGY FOLLOW UP OFFICE NOTE  Felicia Lambert [id number]  Assessment/Plan:   Migraine with aura, without status migrainosus, not intractable/vestibular migraine  Migraine prevention:  She would like to avoid starting a new medication.  Instead, she will work with her gynecologist about changing/discontinuing her birth control medication and work on lifestyle modification.  Otherwise, consider topiramate Migraine rescue:  Her pain concern is the panic attack that occurs with the episodes.  Will prescribe propranolol  10mg  as needed for acute treatment Lifestyle modification: Limit use of pain relievers to no more than 9 days out of the month to prevent risk of rebound or medication-overuse headache. Diet modification/hydration/caffeine cessation Routine exercise Sleep hygiene Consider vitamins/supplements:  magnesium citrate 400mg  daily, riboflavin 400mg  daily, CoQ10 100mg  three times daily Keep headache diary Follow up 6 months.    Subjective:  Felicia Lambert is a 33 year old female with irritable bowel syndrome and anxiety who follows up for headaches and visual disturbance.  UPDATE: Dizzy spells with head pressure has returned.  Seems to be anxiety and stress induced as well as menses and change in barometric pressure, standing or while driving.  The dizziness may last an hour but off and on throughout the day.  During week of menses, occurs 4 consecutive days but may have total of 15 days a month.    HISTORY: She had a viral illness in December 2020 presenting as flu-like symptoms with peripheral vision loss.  Unclear if it was COVID because they did not have testing at that time.  Since then, she has had various chronic symptomatology.   She has had migraines all of her life but started having increased headaches.  It is a pressure/throbbing involving various locations on her head, but typically back of head (unilateral or bilateral) when severe.  Unsure if it is positional.   Associated with photophobia and phonophobia but not nausea.  Lasts 1 to several hours.  Treats with ibuprofen about once a week.  Occurs 3 to 5 days a week.  Needs to take a nap.  Environmental allergies, heat and anxiety are triggers.  She has had episodes of dizziness, described as lightheadedness, not spinning.  It occurs spontaneously.  She will feel hot, diaphoretic, palpitations, chest tightness, tunnel vision, stars in her vision and sensation that she is going to pass out but doesn't.  Sometimes with head pressure.  Lasts several hours.  Used to occur frequently.  She was diagnosed with IBS and started on a low FODMAP diet.  These episodes have dramatically decreased.  On a few occasions, she has had visual symptoms described as seeing TV static in her peripheral vision lasting 20-30 minutes.  No associated headache.  One time, she had blackout of central vision in both eyes that lasted an hour.  She had a dilated eye exam by Dr. Marcey of ophthalmology on 08/12/2023 which was unremarkable with no evidence of papilledema or other concerning abnormalities.  She reports episodes of a sensory disturbance in her hands and feet.  She describes it as an emptiness.  It is not numb, tingling, or burning.  It usually occurs at work when she has been sitting at her desk on the computer.  Will get up and move around.  Resolves after 30 minutes.  Frequency variable - may occur daily for a week and then 2 weeks without episode.    MRI of brain with and without contrast on 06/26/2023 was normal.    Prior blood work has been unremarkable, including CBC,  CMP, TSH 0.68, vit D 37.22, B12 323, folate 10.5, Hgb A1c 5.1, negative HIV, negative RPR  She has longstanding history of anxiety.  She reports that she had passed out on several occasions as a child into her teens.  Thought to be due to low blood sugar.  Never saw the doctor.  She was previously evaluated by cardiology for palpitations.  Workup reportedly  negative.    She reports that she had hit her head on several occasions as a child.    PAST MEDICAL HISTORY: Past Medical History:  Diagnosis Date   COVID-19    02/2020   Hemorrhoids    IBS (irritable bowel syndrome)     MEDICATIONS: Current Outpatient Medications on File Prior to Visit  Medication Sig Dispense Refill   AUROVELA 24 FE 1-20 MG-MCG(24) tablet TAKE 1 TABLET BY MOUTH DAILY 84 tablet 2   BIOTIN PO Take by mouth daily.     EPINEPHrine  0.3 mg/0.3 mL IJ SOAJ injection Inject 0.3 mg into the muscle as needed for anaphylaxis. 1 each 0   loratadine (CLARITIN) 10 MG tablet Take 10 mg by mouth daily.     Probiotic Product (PROBIOTIC PO) Take by mouth.     SALINE NASAL SPRAY NA as needed.     VITAMIN D  PO Take 50 mg by mouth daily.     No current facility-administered medications on file prior to visit.    ALLERGIES: Allergies  Allergen Reactions   Peanut-Containing Drug Products Anaphylaxis   Almond (Diagnostic)    Amoxicillin     GI issues   Apple    Avocado    Cefdinir      Dizzy,flushed   Seasonal Ic [Cholestatin]    Watermelon [Citrullus Vulgaris]     FAMILY HISTORY: Family History  Problem Relation Age of Onset   Neuropathy Father    Stroke Father    Thyroid  disease Father    Pulmonary embolism Father        thought related to Covid diagnosis   Deep vein thrombosis Father    Factor V Leiden deficiency Father        prothrombin gene mutation   CVA Father    Hypothyroidism Paternal Aunt    Hypothyroidism Paternal Grandfather    Colon cancer Neg Hx    Esophageal cancer Neg Hx    Stomach cancer Neg Hx       Objective:  Blood pressure 135/88, pulse 98, height 5' 2 (1.575 m), weight 171 lb 9.6 oz (77.8 kg), last menstrual period 09/27/2024, SpO2 97%. General: No acute distress.  Patient appears well-groomed.   Head:  Normocephalic/atraumatic Neck:  Supple.  No paraspinal tenderness.  Full range of motion. Heart:  Regular rate and rhythm. Neuro:   Alert and oriented.  Speech fluent and not dysarthric.  Language intact.  CN II-XII intact.  Bulk and tone normal.  Muscle strength 5/5 throughout.  Sensation to light touch intact.  Deep tendon reflexes 2+ throughout, toes downgoing.  Gait normal.  Romberg negative.   Juliene Dunnings, DO  CC: Heron Sharper, MD

## 2025-01-11 ENCOUNTER — Encounter: Admitting: Dermatology

## 2025-02-08 ENCOUNTER — Ambulatory Visit: Admitting: Obstetrics and Gynecology

## 2025-05-14 ENCOUNTER — Ambulatory Visit: Payer: Self-pay | Admitting: Neurology
